# Patient Record
Sex: Male | Born: 1945 | Race: Black or African American | Hispanic: No | Smoking: Never smoker
Health system: Southern US, Community
[De-identification: ages and names within clinical notes are randomized; demographics above are authoritative.]

## PROBLEM LIST (undated history)

## (undated) DIAGNOSIS — I10 Essential (primary) hypertension: Secondary | ICD-10-CM

## (undated) DIAGNOSIS — E785 Hyperlipidemia, unspecified: Secondary | ICD-10-CM

## (undated) DIAGNOSIS — Z5189 Encounter for other specified aftercare: Secondary | ICD-10-CM

## (undated) DIAGNOSIS — H409 Unspecified glaucoma: Secondary | ICD-10-CM

## (undated) DIAGNOSIS — IMO0002 Reserved for concepts with insufficient information to code with codable children: Secondary | ICD-10-CM

## (undated) HISTORY — DX: Hyperlipidemia, unspecified: E78.5

## (undated) HISTORY — DX: Unspecified glaucoma: H40.9

## (undated) HISTORY — PX: OTHER SURGICAL HISTORY: SHX169

## (undated) HISTORY — DX: Encounter for other specified aftercare: Z51.89

## (undated) HISTORY — DX: Reserved for concepts with insufficient information to code with codable children: IMO0002

## (undated) HISTORY — PX: CYST EXCISION: SHX5701

## (undated) HISTORY — DX: Essential (primary) hypertension: I10

---

## 2010-01-20 ENCOUNTER — Ambulatory Visit: Payer: Self-pay | Admitting: Family Medicine

## 2010-01-20 DIAGNOSIS — E785 Hyperlipidemia, unspecified: Secondary | ICD-10-CM

## 2010-01-20 DIAGNOSIS — T783XXA Angioneurotic edema, initial encounter: Secondary | ICD-10-CM | POA: Insufficient documentation

## 2010-01-20 DIAGNOSIS — I10 Essential (primary) hypertension: Secondary | ICD-10-CM | POA: Insufficient documentation

## 2010-12-14 NOTE — Assessment & Plan Note (Signed)
Summary: SWOLLEN LIP/KH   Vital Signs:  Patient Profile:   65 Years Old Male CC:      Swollen lip x 1 hr O2 Sat:      98 % O2 treatment:    Room Air Temp:     97.1 degrees F oral Pulse rate:   54 / minute Pulse rhythm:   regular Resp:     16 per minute BP sitting:   107 / 70  (right arm) Cuff size:   regular  Vitals Entered By: Emilio Math (January 20, 2010 2:57 PM)                  Prior Medication List:  No prior medications documented  Current Allergies: No known allergies History of Present Illness Chief Complaint: Swollen lip x 1 hr History of Present Illness: Patient on the way to eye doctor when he started notice swelling of the lower gum.  His blood pressure was elevated which finally came down. He is feeling better now but of course has concern.  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  Eyes       Denies change in vision, eye pain, eye discharge, glasses, contact lenses, and eye surgery. Ear/Nose/Throat/Mouth       Denies hearing loss/aids, change in hearing, ear pain, ear discharge, dizziness, frequent runny nose, frequent nose bleeds, sinus problems, sore throat, hoarseness, and tooth pain or bleeding.  Respiratory       Denies dry cough, productive cough, wheezing, shortness of breath, asthma, bronchitis, and emphysema/COPD.  Cardiovascular       Denies murmurs, chest pain, and tires easily with exhertion.     Gastrointestinal       Denies stomach pain, nausea/vomiting, diarrhea, constipation, blood in bowel movements, and indigestion. Genitourniary       Denies painful urination, kidney stones, and loss of urinary control. Neurological       Denies paralysis, seizures, and fainting/blackouts. Musculoskeletal       Complains of swelling.      Denies muscle pain, joint pain, joint stiffness, decreased range of motion, redness, muscle weakness, and gout.  Skin       Denies bruising, unusual mles/lumps or sores, and hair/skin or nail changes.  Psych       Denies mood changes, temper/anger issues, anxiety/stress, speech problems, depression, and sleep problems.  Past History:  Family History: Last updated: 01/20/2010 Mother, D, Breast CA Father, HTN, Stroke, D  Social History: Last updated: 01/20/2010 Non-smoker ETOH-yes No Drugs Retired Air cabin crew  Past Medical History: Hyperlipidemia Hypertension  Past Surgical History: Exploratory  Family History: Reviewed history and no changes required. Mother, D, Breast CA Father, HTN, Stroke, D  Social History: Reviewed history and no changes required. 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  Plan New Medications/Changes: ALLEGRA 180 MG /  CLATITIN 10 MG 1 by  mouth qday until swelling of lip resolves  #30 x 0, 01/20/2010, Hassan Rowan MD DIOVAN HCT 80-12.5 MG TABS (VALSARTAN-HYDROCHLOROTHIAZIDE) Take 1 tab daily  #30 x 0, 01/20/2010, Hassan Rowan MD NORVASC 2.5 MG TABS (AMLODIPINE BESYLATE) 1 by mouth q day  #30 x 0, 01/20/2010, Hassan Rowan MD  New Orders: New Patient Level III [phone] Solumedrol up to 125mg  [J2930] Epinephrine 1ml [J0170] Epinephrine 1ml [J0170] Solumedrol up to 125mg  [J2930] Admin of Therapeutic Inj  intramuscular or subcutaneous [96372] Planning Comments:   will need long term fgolow up  stop lotrel or any other aces  Follow Up: Follow up in 2-3 days if no improvement, Follow up with Primary Physician Work/School Excuse: Return to work/school tomorrow  The patient and/or caregiver has been counseled thoroughly with regard to medications prescribed including dosage, schedule, interactions, rationale for use, and possible side effects and they verbalize understanding.  Diagnoses and expected course of recovery discussed and will return if not improved as expected or if the condition worsens. Patient and/or caregiver verbalized understanding.  Prescriptions: ALLEGRA 180 MG /  CLATITIN 10 MG 1 by mouth qday until swelling of lip resolves  #30 x 0   Entered and Authorized by:   Hassan Rowan MD   Signed by:   Hassan Rowan MD on 01/20/2010   Method used:   Print then Give to Patient   RxID:   [id number] DIOVAN HCT 80-12.5 MG TABS (VALSARTAN-HYDROCHLOROTHIAZIDE) Take 1 tab daily  #30 x 0   Entered and Authorized by:   Hassan Rowan MD   Signed by:   Hassan Rowan MD on 01/20/2010   Method used:   Print then Give to Patient   RxID:   [id number] NORVASC 2.5 MG TABS (AMLODIPINE BESYLATE) 1 by mouth q day  #30 x 0   Entered and Authorized by:   Hassan Rowan MD   Signed by:   Hassan Rowan MD on 01/20/2010   Method used:   Print then Give to Patient   RxID:   [id number]   Patient Instructions: 1)  Stop taking any  ACES 2)  Switch to [facility] to help control BP  3)  use claritin or alegra s neded for lip swelling 4)  Please schedule an appointment with your primary doctor in :1 to 20 days  5)  seek imediate medical care if futher swelling or shortness of breatgh occurs   Medication Administration  Injection # 1:    Medication: Solumedrol up to 125mg     Diagnosis: ANGIOEDEMA (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.1)    Route: IM    Site: RUOQ gluteus    Exp Date: 07/15/2012    Lot #: [id number]    Mfr: Pharmacia    Patient tolerated injection without complications    Given by: Emilio Math (January 20, 2010 3:52 PM)  Injection # 2:    Medication: Epinephrine 1ml    Diagnosis: ALLERGIC REACTION (ICD-995.3)    Route: SQ    Site: RUOQ gluteus    Exp Date: 08/14/2010    Lot #: [id number]    Mfr: Francisca December    Comments: x3  Given by: Emilio Math (January 20, 2010 3:56 PM)  Orders Added: 1)  New Patient Level III [99203] 2)  Solumedrol up to 125mg  [J2930] 3)  Epinephrine 1ml [J0170] 4)  Epinephrine 1ml [J0170] 5)  Solumedrol up to 125mg  [J2930] 6)  Admin of Therapeutic Inj  intramuscular or subcutaneous [96372]   Appended Document: SWOLLEN LIP/KH Pt came in requesting BP check. Pt stated he was hitting golf balls, went into pharmacy to check his BP, results were high. Checked pt's BP three times: 1st: 155/88 P 76, 2nd: 133/82 P 67, 3rd 122-82 P 67. Pt was given a PCP card for upstairs so that he could call on Monday, 05/24/10 to establish w/them. Pt stated he did not have a PCP and was having his medications filled by PCP in [place]. Pt presented empty medication bottles that were last refilled in May/2011. Per Dr. Cathren Harsh, pt was advised by the physician on his visit here on 01/20/2010 that he needed a local PCP to follow his HTN and hyperlipidemia. Pt statesd he understood and would contact [facility] on Monday, 05/24/10.

## 2011-11-29 ENCOUNTER — Ambulatory Visit (INDEPENDENT_AMBULATORY_CARE_PROVIDER_SITE_OTHER): Payer: Managed Care, Other (non HMO) | Admitting: Emergency Medicine

## 2011-11-29 DIAGNOSIS — E782 Mixed hyperlipidemia: Secondary | ICD-10-CM

## 2011-11-29 DIAGNOSIS — I10 Essential (primary) hypertension: Secondary | ICD-10-CM

## 2011-11-30 ENCOUNTER — Ambulatory Visit (INDEPENDENT_AMBULATORY_CARE_PROVIDER_SITE_OTHER): Payer: Managed Care, Other (non HMO)

## 2011-11-30 DIAGNOSIS — I10 Essential (primary) hypertension: Secondary | ICD-10-CM

## 2011-11-30 DIAGNOSIS — E7800 Pure hypercholesterolemia, unspecified: Secondary | ICD-10-CM

## 2012-01-03 ENCOUNTER — Telehealth: Payer: Self-pay

## 2012-01-03 NOTE — Telephone Encounter (Signed)
Pharmacy needs auth to do 90day rx instead of 30day rx for pt please contact pharmacy [phone]

## 2012-01-04 ENCOUNTER — Other Ambulatory Visit: Payer: Self-pay | Admitting: Physician Assistant

## 2012-01-04 NOTE — Telephone Encounter (Signed)
Pt 2nd message he is out of his blood pressure meds and needs Dr to call pharmacy and change rx to 90 day instead of 30 day

## 2012-01-05 NOTE — Telephone Encounter (Signed)
What are his meds?

## 2012-01-05 NOTE — Telephone Encounter (Signed)
Doses? When was his last visit?  Next visit?  OK to RF either until next visit, or x 30 days if not seen in past 6 months. csj

## 2012-01-05 NOTE — Telephone Encounter (Signed)
Pt states he is on Amlodipine and Diovan. He needs 90 day supply of these and uses [facility].

## 2012-01-05 NOTE — Telephone Encounter (Signed)
Spoke with [facility] and changed Rxs that Dr Cleta Alberts wrote in Jan to 90 day at a time x 1 yr RFs instead of 30 days. Pt currently taking Amlodipine 10 QD and Diovan 160 QD. Notified pt.

## 2012-01-11 ENCOUNTER — Telehealth: Payer: Self-pay

## 2012-01-11 NOTE — Telephone Encounter (Signed)
Patient states he received his 90 day rx on two of his medications but still needs his 90day rx for his provestatan 40mg  he would like for a nurse or dr to contact  Him @ [phone]

## 2012-01-12 NOTE — Telephone Encounter (Signed)
Called [facility] and OKd change to 90 day supply of pravastatin ( with 1 yr RFs as written by Dr Cleta Alberts originally). Pt had stopped at 102 to check on this, and is aware this is being done

## 2012-07-03 ENCOUNTER — Encounter: Payer: Self-pay | Admitting: Emergency Medicine

## 2012-07-03 ENCOUNTER — Other Ambulatory Visit: Payer: Self-pay | Admitting: Emergency Medicine

## 2012-07-03 ENCOUNTER — Ambulatory Visit (INDEPENDENT_AMBULATORY_CARE_PROVIDER_SITE_OTHER): Payer: Managed Care, Other (non HMO) | Admitting: Emergency Medicine

## 2012-07-03 VITALS — BP 139/72 | HR 55 | Temp 97.7°F | Resp 16 | Ht 66.0 in | Wt 160.0 lb

## 2012-07-03 DIAGNOSIS — I10 Essential (primary) hypertension: Secondary | ICD-10-CM

## 2012-07-03 DIAGNOSIS — E785 Hyperlipidemia, unspecified: Secondary | ICD-10-CM

## 2012-07-03 DIAGNOSIS — D369 Benign neoplasm, unspecified site: Secondary | ICD-10-CM

## 2012-07-03 MED ORDER — AMLODIPINE BESYLATE 10 MG PO TABS: 10.0000 mg | ORAL_TABLET | Freq: Every day | ORAL | Status: DC

## 2012-07-03 MED ORDER — VALSARTAN 160 MG PO TABS: 160.0000 mg | ORAL_TABLET | Freq: Every day | ORAL | Status: DC

## 2012-07-03 MED ORDER — PRAVASTATIN SODIUM 40 MG PO TABS: 40.0000 mg | ORAL_TABLET | Freq: Every day | ORAL | Status: DC

## 2012-07-03 NOTE — Progress Notes (Signed)
  Subjective:    Patient ID: Justin Walter, male    DOB: 08/08/1946, 66 y.o.   MRN: [id number]  HPI patient here to get referral for colonoscopy. He has a history of adenomatous polyps. He is not sure whether his last colonoscopy was 3-5 years ago. He had records sent here from [place] but I am unable to locate them. We will try and call his doctor in [place] and see if we get a refax of those records. He is followed here for high cholesterol and high blood pressure. He does need a refill on these medications    Review of Systems     Objective:   Physical Exam HEENT exam is unremarkable neck is supple chest clear abdomen soft there is a large scar from the subxiphoid area down to the pubic symphysis from previous exploratory surgery        Assessment & Plan:  Go ahead and set up an appointment see Dr. Audley Hose so he can have repeat colonoscopy. I've asked him to come in in 3 months we can do fasting blood work.

## 2012-07-04 ENCOUNTER — Telehealth: Payer: Self-pay | Admitting: *Deleted

## 2012-07-04 NOTE — Telephone Encounter (Signed)
07/04/2012 at 1325, patient was call I advised him the referral will be made to see Dr Elnoria Howard. He will be contacted by their office with the date/time for the appointment. Also, colonoscopy results from Dr Corinna Capra will be faxed.

## 2012-08-29 ENCOUNTER — Telehealth: Payer: Self-pay

## 2012-08-29 DIAGNOSIS — I10 Essential (primary) hypertension: Secondary | ICD-10-CM

## 2012-08-29 DIAGNOSIS — E785 Hyperlipidemia, unspecified: Secondary | ICD-10-CM

## 2012-08-29 NOTE — Telephone Encounter (Signed)
Pt would like a lab order prior to appt on 11-12. He said same labs as last time.

## 2012-08-29 NOTE — Telephone Encounter (Signed)
Please let patient know that I have put lab orders in.  Unfortunately at the last visit, Dr. Cleta Alberts did not specify which labs he wanted drawn.  I put orders in for routine labs, but there's a chance that we may need to draw more labs at his visit.  Additionally he will have to pay a co-pay (if he has one) for the lab visit as well.  But, the orders are in if he would still like to do labs prior to his next visit.  He will need to be fasting for at least 8 hours prior for the lipid panel.

## 2012-08-29 NOTE — Telephone Encounter (Signed)
Called patient to advise of this.

## 2012-09-05 ENCOUNTER — Other Ambulatory Visit (INDEPENDENT_AMBULATORY_CARE_PROVIDER_SITE_OTHER): Payer: Managed Care, Other (non HMO)

## 2012-09-05 DIAGNOSIS — E785 Hyperlipidemia, unspecified: Secondary | ICD-10-CM

## 2012-09-05 DIAGNOSIS — I10 Essential (primary) hypertension: Secondary | ICD-10-CM

## 2012-09-05 LAB — CBC
MCV: 85.3 fL (ref 78.0–100.0)
Platelets: 201 10*3/uL (ref 150–400)
RBC: 4.98 MIL/uL (ref 4.22–5.81)
RDW: 13.6 % (ref 11.5–15.5)
WBC: 7.5 10*3/uL (ref 4.0–10.5)

## 2012-09-05 NOTE — Progress Notes (Signed)
PATIENT IN OFFICE TODAY FOR LABS ONLY

## 2012-09-06 LAB — COMPREHENSIVE METABOLIC PANEL
Albumin: 4.4 g/dL (ref 3.5–5.2)
BUN: 11 mg/dL (ref 6–23)
Calcium: 9.7 mg/dL (ref 8.4–10.5)
Chloride: 106 mEq/L (ref 96–112)
Glucose, Bld: 77 mg/dL (ref 70–99)
Potassium: 3.9 mEq/L (ref 3.5–5.3)

## 2012-09-06 LAB — LIPID PANEL
Cholesterol: 165 mg/dL (ref 0–200)
HDL: 41 mg/dL (ref >39)
Triglycerides: 118 mg/dL (ref <150)

## 2012-10-02 ENCOUNTER — Encounter: Payer: Self-pay | Admitting: Emergency Medicine

## 2012-10-02 ENCOUNTER — Ambulatory Visit (INDEPENDENT_AMBULATORY_CARE_PROVIDER_SITE_OTHER): Payer: Managed Care, Other (non HMO) | Admitting: Emergency Medicine

## 2012-10-02 VITALS — BP 131/69 | HR 70 | Temp 97.8°F | Resp 16 | Ht 67.0 in | Wt 165.0 lb

## 2012-10-02 DIAGNOSIS — I10 Essential (primary) hypertension: Secondary | ICD-10-CM

## 2012-10-02 DIAGNOSIS — E782 Mixed hyperlipidemia: Secondary | ICD-10-CM

## 2012-10-02 DIAGNOSIS — E785 Hyperlipidemia, unspecified: Secondary | ICD-10-CM

## 2012-10-02 MED ORDER — VALSARTAN 160 MG PO TABS: 160.0000 mg | ORAL_TABLET | Freq: Every day | ORAL | Status: DC

## 2012-10-02 MED ORDER — PRAVASTATIN SODIUM 40 MG PO TABS: 40.0000 mg | ORAL_TABLET | Freq: Every day | ORAL | Status: DC

## 2012-10-02 MED ORDER — AMLODIPINE BESYLATE 10 MG PO TABS: 10.0000 mg | ORAL_TABLET | Freq: Every day | ORAL | Status: DC

## 2012-10-02 NOTE — Progress Notes (Signed)
  Subjective:    Patient ID: Justin Walter, male    DOB: [birth year], 66 y.o.   MRN: [id number]  HPI #1 high blood pressure. He is in his medications refilled. He did have trouble with bradycardia in the past but this was related to the beta blocker eye drops. He denies chest pain shortness of breath or any other cardiac symptoms at the present time.  Problem #2 is high cholesterol. He's currently on Pravachol watching his diet and exercising regular. Problem #3 bradycardia this has not been an issue since he stopped his beta blocker eye drops. He continues to exercise on regular basis and has no presyncope to    Review of Systems     Objective:   Physical Exam HEENT exam is unremarkable. His neck was supple. Chest was clear to auscultation and percussion cardiac exam revealed regular rate without murmurs        Assessment & Plan:  Patient stable at present. Blood pressure was a goal his lipid panel is a goal he is having no symptoms of bradycardia. We'll recheck in 6 months. He has a history of an elevated PSA and this is being followed closely every 6 months at [facility].

## 2012-11-26 ENCOUNTER — Ambulatory Visit (INDEPENDENT_AMBULATORY_CARE_PROVIDER_SITE_OTHER): Payer: Managed Care, Other (non HMO) | Admitting: Family Medicine

## 2012-11-26 VITALS — BP 132/76 | HR 89 | Temp 99.1°F | Resp 16 | Ht 67.0 in | Wt 165.2 lb

## 2012-11-26 DIAGNOSIS — B349 Viral infection, unspecified: Secondary | ICD-10-CM

## 2012-11-26 DIAGNOSIS — R300 Dysuria: Secondary | ICD-10-CM

## 2012-11-26 DIAGNOSIS — B9789 Other viral agents as the cause of diseases classified elsewhere: Secondary | ICD-10-CM

## 2012-11-26 DIAGNOSIS — R509 Fever, unspecified: Secondary | ICD-10-CM

## 2012-11-26 LAB — POCT UA - MICROSCOPIC ONLY
Casts, Ur, LPF, POC: NEGATIVE
Yeast, UA: NEGATIVE

## 2012-11-26 LAB — POCT URINALYSIS DIPSTICK
Bilirubin, UA: NEGATIVE
Glucose, UA: NEGATIVE
Nitrite, UA: NEGATIVE
Urobilinogen, UA: 0.2
pH, UA: 5.5

## 2012-11-26 LAB — POCT CBC
Granulocyte percent: 71.8 %G (ref 37–80)
MID (cbc): 0.3 (ref 0–0.9)
MPV: 9.7 fL (ref 0–99.8)
POC MID %: 5.5 %M (ref 0–12)
Platelet Count, POC: 163 10*3/uL (ref 142–424)
RBC: 5.7 M/uL (ref 4.69–6.13)

## 2012-11-26 LAB — POCT INFLUENZA A/B: Influenza B, POC: NEGATIVE

## 2012-11-26 NOTE — Patient Instructions (Signed)
Fluids  Rest  Return if worse  Tylenol or ibuprofen for fever or body aches.

## 2012-11-26 NOTE — Progress Notes (Signed)
Subjective: 67 year old man who is here with a history of having gotten ill this weekend.  He started running a fever on Saturday. He has had chills through yesterday. He never took his temperature. He has a rather shortage of other symptoms.He had a slight headache but nothing much else except possibly some mild dysuria. No URI or cough or stomach problems. He did not take his temperature but he knows he chills. He is single and lives alone. He does go to yoga 2 times a week.  Objective: Pleasant gentleman, alert and oriented, in no major distress. His TMs are normal. Throat clear. Neck supple without significant nodes. Chest is clear to auscultation. Heart regular without murmurs. Abdomen soft without masses or tenderness. No CVA tenderness.  Assessment: Febrile illness etiology unclear  Plan: Check flu swab, urinalysis, and CBC  Results for orders placed in visit on 11/26/12  POCT CBC      Component Value Range   WBC 5.8  4.6 - 10.2 K/uL   Lymph, poc 1.3  0.6 - 3.4   POC LYMPH PERCENT 22.7  10 - 50 %L   MID (cbc) 0.3  0 - 0.9   POC MID % 5.5  0 - 12 %M   POC Granulocyte 4.2  2 - 6.9   Granulocyte percent 71.8  37 - 80 %G   RBC 5.70  4.69 - 6.13 M/uL   Hemoglobin 17.0  14.1 - 18.1 g/dL   HCT, POC 29.5  28.4 - 53.7 %   MCV 91.2  80 - 97 fL   MCH, POC 29.8  27 - 31.2 pg   MCHC 32.7  31.8 - 35.4 g/dL   RDW, POC 13.2     Platelet Count, POC 163  142 - 424 K/uL   MPV 9.7  0 - 99.8 fL  POCT INFLUENZA A/B      Component Value Range   Influenza A, POC Negative     Influenza B, POC Negative    POCT UA - MICROSCOPIC ONLY      Component Value Range   WBC, Ur, HPF, POC 2-5     RBC, urine, microscopic 0-1     Bacteria, U Microscopic neg     Mucus, UA neg     Epithelial cells, urine per micros 0-1     Crystals, Ur, HPF, POC neg     Casts, Ur, LPF, POC neg     Yeast, UA neg    POCT URINALYSIS DIPSTICK      Component Value Range   Color, UA yellow     Clarity, UA clear     Glucose,  UA neg     Bilirubin, UA neg     Ketones, UA neg     Spec Grav, UA 1.010     Blood, UA neg     pH, UA 5.5     Protein, UA neg     Urobilinogen, UA 0.2     Nitrite, UA neg     Leukocytes, UA Trace     Labs look good. This is consistent with a viral illness. Will treat symptomatically.  Denies STD risk  Will culture urine

## 2013-01-22 DIAGNOSIS — H4010X0 Unspecified open-angle glaucoma, stage unspecified: Secondary | ICD-10-CM | POA: Diagnosis not present

## 2013-03-06 DIAGNOSIS — N401 Enlarged prostate with lower urinary tract symptoms: Secondary | ICD-10-CM | POA: Diagnosis not present

## 2013-03-06 DIAGNOSIS — R972 Elevated prostate specific antigen [PSA]: Secondary | ICD-10-CM | POA: Diagnosis not present

## 2013-03-27 DIAGNOSIS — H4011X0 Primary open-angle glaucoma, stage unspecified: Secondary | ICD-10-CM | POA: Diagnosis not present

## 2013-03-28 ENCOUNTER — Ambulatory Visit (INDEPENDENT_AMBULATORY_CARE_PROVIDER_SITE_OTHER): Payer: Medicare Other | Admitting: Emergency Medicine

## 2013-03-28 VITALS — BP 118/71 | HR 58 | Temp 98.0°F | Resp 16 | Ht 66.5 in | Wt 163.6 lb

## 2013-03-28 DIAGNOSIS — B079 Viral wart, unspecified: Secondary | ICD-10-CM | POA: Diagnosis not present

## 2013-03-28 DIAGNOSIS — I10 Essential (primary) hypertension: Secondary | ICD-10-CM | POA: Diagnosis not present

## 2013-03-28 DIAGNOSIS — E785 Hyperlipidemia, unspecified: Secondary | ICD-10-CM

## 2013-03-28 DIAGNOSIS — L989 Disorder of the skin and subcutaneous tissue, unspecified: Secondary | ICD-10-CM

## 2013-03-28 LAB — POCT CBC
Hemoglobin: 13.7 g/dL — AB (ref 14.1–18.1)
Lymph, poc: 2 (ref 0.6–3.4)
MCH, POC: 29.3 pg (ref 27–31.2)
MCHC: 32 g/dL (ref 31.8–35.4)
MID (cbc): 0.3 (ref 0–0.9)
MPV: 9.7 fL (ref 0–99.8)
POC LYMPH PERCENT: 44.3 %L (ref 10–50)
POC MID %: 6.9 %M (ref 0–12)
RDW, POC: 14 %
WBC: 4.5 10*3/uL — AB (ref 4.6–10.2)

## 2013-03-28 LAB — COMPREHENSIVE METABOLIC PANEL
ALT: 29 U/L (ref 0–53)
Alkaline Phosphatase: 60 U/L (ref 39–117)
CO2: 22 mEq/L (ref 19–32)
Creat: 1.06 mg/dL (ref 0.50–1.35)
Total Bilirubin: 0.7 mg/dL (ref 0.3–1.2)

## 2013-03-28 MED ORDER — PRAVASTATIN SODIUM 40 MG PO TABS: 40.0000 mg | ORAL_TABLET | Freq: Every day | ORAL | Status: DC

## 2013-03-28 MED ORDER — AMLODIPINE BESYLATE 10 MG PO TABS: 10.0000 mg | ORAL_TABLET | Freq: Every day | ORAL | Status: DC

## 2013-03-28 MED ORDER — VALSARTAN 160 MG PO TABS: 160.0000 mg | ORAL_TABLET | Freq: Every day | ORAL | Status: DC

## 2013-03-28 NOTE — Progress Notes (Signed)
  Subjective:    Patient ID: Justin Walter, male    DOB: [birth year], 67 y.o.   MRN: [id number]  HPI presents today with skin tag above R eye beside eyebrow and would like that removed. Also has question on Diovan and wants to discuss getting that in generic. Wants to discuss the difference. Will needs 90 day supply on all medications. Wants those printed and not e-scribed. Not sure if last physical hd his ears checked.     Review of Systems     Objective:   Physical Exam patient has a 3 mm skin tag at the bridge of the nose on the right just medial to the right eyebrow neck is supple chest clear heart regular rate no murmurs abdomen is soft nontender liver and spleen not enlarged extremities no edema.        Assessment & Plan:  Lesion of the left eyebrow will be sent for pathology labs were done patient scheduled for PE

## 2013-03-29 ENCOUNTER — Telehealth: Payer: Self-pay | Admitting: Emergency Medicine

## 2013-03-29 NOTE — Telephone Encounter (Signed)
Call patient let him know his labs are normal. The sugar was slightly low. Just make sure he does not skip meals.

## 2013-03-29 NOTE — Telephone Encounter (Signed)
Left message for him to call me back.  

## 2013-03-31 NOTE — Telephone Encounter (Signed)
Pt notified of labs

## 2013-07-31 DIAGNOSIS — H4011X0 Primary open-angle glaucoma, stage unspecified: Secondary | ICD-10-CM | POA: Diagnosis not present

## 2013-07-31 DIAGNOSIS — R972 Elevated prostate specific antigen [PSA]: Secondary | ICD-10-CM | POA: Diagnosis not present

## 2013-08-07 DIAGNOSIS — N402 Nodular prostate without lower urinary tract symptoms: Secondary | ICD-10-CM | POA: Diagnosis not present

## 2013-08-07 DIAGNOSIS — R972 Elevated prostate specific antigen [PSA]: Secondary | ICD-10-CM | POA: Diagnosis not present

## 2013-08-08 ENCOUNTER — Other Ambulatory Visit (HOSPITAL_COMMUNITY): Payer: Self-pay | Admitting: Urology

## 2013-08-08 DIAGNOSIS — N402 Nodular prostate without lower urinary tract symptoms: Secondary | ICD-10-CM

## 2013-08-14 ENCOUNTER — Ambulatory Visit (HOSPITAL_COMMUNITY)
Admission: RE | Admit: 2013-08-14 | Discharge: 2013-08-14 | Disposition: A | Discharge status: Home / Self Care | Payer: Medicare Other | Source: Ambulatory Visit | Attending: Urology | Admitting: Urology

## 2013-08-14 DIAGNOSIS — N400 Enlarged prostate without lower urinary tract symptoms: Secondary | ICD-10-CM | POA: Diagnosis not present

## 2013-08-14 DIAGNOSIS — R972 Elevated prostate specific antigen [PSA]: Secondary | ICD-10-CM | POA: Insufficient documentation

## 2013-08-14 DIAGNOSIS — N402 Nodular prostate without lower urinary tract symptoms: Secondary | ICD-10-CM

## 2013-08-14 LAB — CREATININE, SERUM
Creatinine, Ser: 1.04 mg/dL (ref 0.50–1.35)
GFR calc Af Amer: 85 mL/min — ABNORMAL LOW (ref >90)
GFR calc non Af Amer: 73 mL/min — ABNORMAL LOW (ref >90)

## 2013-08-14 MED ORDER — GADOBENATE DIMEGLUMINE 529 MG/ML IV SOLN
15.0000 mL | Freq: Once | INTRAVENOUS | Status: AC | PRN
  Administered 2013-08-14: 15 mL via INTRAVENOUS

## 2013-08-26 DIAGNOSIS — R972 Elevated prostate specific antigen [PSA]: Secondary | ICD-10-CM | POA: Diagnosis not present

## 2013-08-26 DIAGNOSIS — N401 Enlarged prostate with lower urinary tract symptoms: Secondary | ICD-10-CM | POA: Diagnosis not present

## 2013-09-03 ENCOUNTER — Ambulatory Visit (INDEPENDENT_AMBULATORY_CARE_PROVIDER_SITE_OTHER): Payer: Medicare Other | Admitting: Family Medicine

## 2013-09-03 VITALS — BP 114/64 | HR 66 | Temp 97.5°F | Resp 18 | Ht 65.75 in | Wt 160.8 lb

## 2013-09-03 DIAGNOSIS — L089 Local infection of the skin and subcutaneous tissue, unspecified: Secondary | ICD-10-CM | POA: Diagnosis not present

## 2013-09-03 DIAGNOSIS — H409 Unspecified glaucoma: Secondary | ICD-10-CM | POA: Diagnosis not present

## 2013-09-03 LAB — POCT CBC
Granulocyte percent: 55.7 %G (ref 37–80)
Hemoglobin: 15.4 g/dL (ref 14.1–18.1)
MCV: 92.3 fL (ref 80–97)
MID (cbc): 0.3 (ref 0–0.9)
MPV: 10.6 fL (ref 0–99.8)
POC Granulocyte: 3.3 (ref 2–6.9)
POC MID %: 5.7 %M (ref 0–12)
Platelet Count, POC: 185 10*3/uL (ref 142–424)
RBC: 5.04 M/uL (ref 4.69–6.13)

## 2013-09-03 MED ORDER — DOXYCYCLINE HYCLATE 100 MG PO TABS: 100.0000 mg | ORAL_TABLET | Freq: Two times a day (BID) | ORAL | Status: DC

## 2013-09-03 NOTE — Patient Instructions (Signed)
Keep your wound covered and clean.  Wash it with regular soap, pat dry and then cover with a bandage.  When the dead skin dries out it can be carefully trimmed.  Please come and see [place] if you have any concerns about how your hand is doing.  It your pain does not continue to improve, or if you start getting worse please come and see [place] right away  You received a Tdap vaccine today- this covers you for tetanus for 10 years, as well as diptheria and pertussis or whooping cough. Your next tetanus shot can be a plain Td vaccine.   Use the doxycycline as directed.  Take with food and water.

## 2013-09-03 NOTE — Progress Notes (Addendum)
[facility] [address] [place] [address] [phone]  Date:  09/03/2013   Name:  Justin Walter   DOB:  1946/08/26   MRN:  [id number]  PCP:  Lucilla Edin, MD    Chief Complaint: Hand Injury   History of Present Illness:  Justin Walter is a 67 y.o. very pleasant male patient who presents with the following:  He had an MRI 3 weeks ago to evaluate his prostate.  He had blood draw/ IV for contrast in his right hand- he did not think he had any particular injury, but he noted some pain in the proximal arm for a few days after the MRI.  This seemed to resolve, but then he noted a pustule on the palm on the right hand and another pustule/ blood blister over the lateral base of the small finger.  The area at the base of the small finger got worse last night and started to hurt more.  Admits that he plays a whole lot of golf and that he may have rubbed his hand.    He is not sure of the date of his last tetanus shot but thinks it was a long time ago He otherwise feels well, and does not note any systemic symptoms such as fever, chills or aches   Patient Active Problem List   Diagnosis Date Noted  . HYPERLIPIDEMIA 01/20/2010  . HYPERTENSION 01/20/2010  . 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    Past Medical History  Diagnosis Date  . Hypertension   . Hyperlipidemia   . Ulcer   . Glaucoma     Past Surgical History  Procedure Laterality Date  . Bleeding ulcer      1971  . Cyst excision      forehead    History  Substance Use Topics  . Smoking status: Never Smoker   . Smokeless tobacco: Not on file  . Alcohol Use: 1.0 oz/week    2 drink(s) per week     Comment: occass    Family History  Problem Relation Age of Onset  . Cancer Mother     pancreatic cancer  . Stroke Father     Allergies  Allergen Reactions  . Lotrel [Amlodipine Besy-Benazepril Hcl] Swelling    Medication list has been reviewed and updated.  Current Outpatient Prescriptions on File  Prior to Visit  Medication Sig Dispense Refill  . amLODipine (NORVASC) 10 MG tablet Take 1 tablet (10 mg total) by mouth daily.  90 tablet  3  . bimatoprost (LUMIGAN) 0.03 % ophthalmic solution 1 drop at bedtime.      . dorzolamide (TRUSOPT) 2 % ophthalmic solution 1 drop 3 (three) times daily.      . pravastatin (PRAVACHOL) 40 MG tablet Take 1 tablet (40 mg total) by mouth daily.  90 tablet  3  . valsartan (DIOVAN) 160 MG tablet Take 1 tablet (160 mg total) by mouth daily.  90 tablet  3   No current facility-administered medications on file prior to visit.    Review of Systems:  As per HPI- otherwise negative.   Physical Examination: Filed Vitals:   09/03/13 0837  BP: 114/64  Pulse: 66  Temp: 97.5 F (36.4 C)  Resp: 18   Filed Vitals:   09/03/13 0837  Height: 5' 5.75" (1.67 m)  Weight: 160 lb 12.8 oz (72.938 kg)   Body mass index is 26.15 kg/(m^2). Ideal Body Weight: Weight in (lb) to have BMI = 25: 153.4  GEN:  WDWN, NAD, Non-toxic, A & O x 3, looks well HEENT: Atraumatic, Normocephalic. Neck supple. No masses, No LAD. Ears and Nose: No external deformity. CV: RRR, No M/G/R. No JVD. No thrill. No extra heart sounds. PULM: CTA B, no wheezes, crackles, rhonchi. No retractions. No resp. distress. No accessory muscle use. EXTR: No c/c/e NEURO Normal gait.  PSYCH: Normally interactive. Conversant. Not depressed or anxious appearing.  Calm demeanor.  Right hand: there is a small lesion on the palm at the base of the 4th [facility]; it appears to be a small blood blister under a callus.  There is no associated redness or tenderness.  This area does not appear to be infected.   At the base of the small finger- lateral side of the hand- there is a nickel sized dark blister that appears to be filled with blood and possibly some pus.  There is slight redness around this lesion.  Normal ROM of the finger, normal cap refill and circulation.    VC obtained.  Area prepped with betadine and  alcohol.  Small incision made with 11 blade through dead skin overlying large blister.  Dark blood and a scant amount of pus expressed.  Dressed with non- stick pad and cobain.  He felt immediate relief.  Culture taken  Results for orders placed in visit on 09/03/13  POCT CBC      Result Value Range   WBC 5.9  4.6 - 10.2 K/uL   Lymph, poc 2.3  0.6 - 3.4   POC LYMPH PERCENT 38.6  10 - 50 %L   MID (cbc) 0.3  0 - 0.9   POC MID % 5.7  0 - 12 %M   POC Granulocyte 3.3  2 - 6.9   Granulocyte percent 55.7  37 - 80 %G   RBC 5.04  4.69 - 6.13 M/uL   Hemoglobin 15.4  14.1 - 18.1 g/dL   HCT, POC 40.9  81.1 - 53.7 %   MCV 92.3  80 - 97 fL   MCH, POC 30.6  27 - 31.2 pg   MCHC 33.1  31.8 - 35.4 g/dL   RDW, POC 91.4     Platelet Count, POC 185  142 - 424 K/uL   MPV 10.6  0 - 99.8 fL    Assessment and Plan: Infection of hand - Plan: POCT CBC, Wound culture, Tdap vaccine greater than or equal to 7yo IM, doxycycline (VIBRA-TABS) 100 MG tablet  Glaucoma  Large blister on right hand, possibly infected.  Treat with doxycycline, update tdap today.  At this time I do not see evidence of dangerous or invasive infection. However he is instructed to keep a careful watch on his hand and to come back or call if he does not see steady improvement.  Advised him not to play golf for at least a week to allow this area to heal.   Signed Abbe Amsterdam, MD  10/24: received final culture result- negative.  He is doing better.  He will follow-up if not continuing to improve

## 2013-09-05 LAB — WOUND CULTURE: Gram Stain: NONE SEEN

## 2013-10-03 ENCOUNTER — Telehealth: Payer: Self-pay

## 2013-10-03 DIAGNOSIS — I10 Essential (primary) hypertension: Secondary | ICD-10-CM

## 2013-10-03 NOTE — Telephone Encounter (Signed)
Valsartan called him to advise

## 2013-10-03 NOTE — Telephone Encounter (Signed)
Patient of Dr Cleta Alberts. Says the last time he saw the doctor he was told about a generic for Diovan that was coming out the first of the year. Wants to know the name of the generic because he needs it for his rx drug plan. He's not scheduled to come back until January. Cb# [phone].

## 2013-11-20 DIAGNOSIS — H4011X0 Primary open-angle glaucoma, stage unspecified: Secondary | ICD-10-CM | POA: Diagnosis not present

## 2013-12-10 ENCOUNTER — Encounter: Payer: Self-pay | Admitting: Emergency Medicine

## 2013-12-10 ENCOUNTER — Encounter: Payer: Medicare Other | Admitting: Emergency Medicine

## 2013-12-10 ENCOUNTER — Ambulatory Visit (INDEPENDENT_AMBULATORY_CARE_PROVIDER_SITE_OTHER): Payer: Medicare Other | Admitting: Emergency Medicine

## 2013-12-10 VITALS — BP 108/60 | HR 55 | Temp 97.8°F | Resp 16 | Ht 66.0 in | Wt 163.4 lb

## 2013-12-10 DIAGNOSIS — Z0000 Encounter for general adult medical examination without abnormal findings: Secondary | ICD-10-CM

## 2013-12-10 DIAGNOSIS — I10 Essential (primary) hypertension: Secondary | ICD-10-CM | POA: Diagnosis not present

## 2013-12-10 DIAGNOSIS — Z23 Encounter for immunization: Secondary | ICD-10-CM

## 2013-12-10 DIAGNOSIS — E785 Hyperlipidemia, unspecified: Secondary | ICD-10-CM

## 2013-12-10 LAB — POCT URINALYSIS DIPSTICK
Bilirubin, UA: NEGATIVE
Blood, UA: NEGATIVE
GLUCOSE UA: NEGATIVE
KETONES UA: NEGATIVE
LEUKOCYTES UA: NEGATIVE
Nitrite, UA: NEGATIVE
Protein, UA: NEGATIVE
Spec Grav, UA: 1.01
Urobilinogen, UA: 0.2
pH, UA: 6

## 2013-12-10 LAB — LIPID PANEL
Cholesterol: 187 mg/dL (ref 0–200)
HDL: 44 mg/dL (ref >39)
LDL CALC: 104 mg/dL — AB (ref 0–99)
Total CHOL/HDL Ratio: 4.3 Ratio
Triglycerides: 195 mg/dL — ABNORMAL HIGH (ref <150)
VLDL: 39 mg/dL (ref 0–40)

## 2013-12-10 LAB — CBC WITH DIFFERENTIAL/PLATELET
BASOS PCT: 0 % (ref 0–1)
Basophils Absolute: 0 10*3/uL (ref 0.0–0.1)
Eosinophils Absolute: 0.1 10*3/uL (ref 0.0–0.7)
Eosinophils Relative: 1 % (ref 0–5)
HCT: 46.1 % (ref 39.0–52.0)
Hemoglobin: 15.5 g/dL (ref 13.0–17.0)
LYMPHS PCT: 43 % (ref 12–46)
Lymphs Abs: 2.4 10*3/uL (ref 0.7–4.0)
MCH: 28.9 pg (ref 26.0–34.0)
MCHC: 33.6 g/dL (ref 30.0–36.0)
MCV: 86 fL (ref 78.0–100.0)
MONOS PCT: 6 % (ref 3–12)
Monocytes Absolute: 0.4 10*3/uL (ref 0.1–1.0)
NEUTROS ABS: 2.8 10*3/uL (ref 1.7–7.7)
NEUTROS PCT: 50 % (ref 43–77)
Platelets: 178 10*3/uL (ref 150–400)
RBC: 5.36 MIL/uL (ref 4.22–5.81)
RDW: 13.8 % (ref 11.5–15.5)
WBC: 5.7 10*3/uL (ref 4.0–10.5)

## 2013-12-10 LAB — COMPLETE METABOLIC PANEL WITH GFR
ALT: 30 U/L (ref 0–53)
AST: 31 U/L (ref 0–37)
Albumin: 4.4 g/dL (ref 3.5–5.2)
Alkaline Phosphatase: 65 U/L (ref 39–117)
BUN: 15 mg/dL (ref 6–23)
CALCIUM: 10 mg/dL (ref 8.4–10.5)
CHLORIDE: 105 meq/L (ref 96–112)
CO2: 28 meq/L (ref 19–32)
CREATININE: 1.3 mg/dL (ref 0.50–1.35)
GFR, EST NON AFRICAN AMERICAN: 57 mL/min — AB
GFR, Est African American: 66 mL/min
GLUCOSE: 85 mg/dL (ref 70–99)
Potassium: 4.4 mEq/L (ref 3.5–5.3)
Sodium: 141 mEq/L (ref 135–145)
Total Bilirubin: 1 mg/dL (ref 0.3–1.2)
Total Protein: 7.7 g/dL (ref 6.0–8.3)

## 2013-12-10 MED ORDER — PRAVASTATIN SODIUM 40 MG PO TABS: 40.0000 mg | ORAL_TABLET | Freq: Every day | ORAL | Status: DC

## 2013-12-10 MED ORDER — AMLODIPINE BESYLATE 10 MG PO TABS: 10.0000 mg | ORAL_TABLET | Freq: Every day | ORAL | Status: DC

## 2013-12-10 MED ORDER — VALSARTAN 160 MG PO TABS: ORAL_TABLET | ORAL | Status: DC

## 2013-12-10 MED ORDER — ZOSTER VACCINE LIVE 19400 UNT/0.65ML ~~LOC~~ SOLR: 0.6500 mL | Freq: Once | SUBCUTANEOUS | Status: DC

## 2013-12-10 MED ORDER — DESONIDE 0.05 % EX CREA: TOPICAL_CREAM | Freq: Two times a day (BID) | CUTANEOUS | Status: AC

## 2013-12-10 NOTE — Progress Notes (Deleted)
   Subjective:    Patient ID: Justin Walter, male    DOB: 1946/06/24, 68 y.o.   MRN: [id number]  HPI    Review of Systems  Constitutional: Negative.   HENT: Negative.   Eyes: Negative.   Respiratory: Negative.   Cardiovascular: Negative.   Gastrointestinal: Negative.   Endocrine: Negative.   Genitourinary: Negative.   Musculoskeletal: Negative.   Skin: Negative.   Allergic/Immunologic: Negative.   Neurological: Negative.   Hematological: Negative.   Psychiatric/Behavioral: Negative.        Objective:   Physical Exam        Assessment & Plan:

## 2013-12-10 NOTE — Progress Notes (Addendum)
This chart was scribed for Arlyss Queen, MD by Roxan Diesel, ED scribe.  This patient was seen in Room 21 and the patient's care was started at 9:20 AM.   Subjective  HPI Justin Walter is a 68 y.o. male who presents for a regular check-up and complete physical exam.  Overall he states he is doing well and he has no complaints at this time.  He saw a urologist recently and had an MRI done which showed an abnormal area in his prostate.  He states that his urologist is considering possibly doing a biopsy of that area.  He was advised to make a decision about this before his next visit.  Pt has had a previous prostate biopsy which was normal.  He denies family h/o prostate cancer.  His last colonoscopy was in 2013.  His urologist is following his PSA at appointments approximately every 6 months.  He has not had shingles shot, pneumonia shot or flu shot.  He is not sure whether he wants shingles or pneumonia shots.  Pt thinks he may have received a blood transfusion due to a bleeding ulcer at age 87 or 68.  He knows that he had exploratory surgery but he is not sure whether he had a transfusion.    He sees an ophthalmologist on a 58-monthschedule.  He does not smoke.  He rides an recumbent bike 3-4 times/week for around 50 minutes, and does light resistance exercise.  He recently began doing yoga as well.  Family history:  Mother died of pancreatic cancer.  Sister had cancer but he is unsure what type.   Patient Active Problem List   Diagnosis Date Noted  . Glaucoma 09/03/2013  . HYPERLIPIDEMIA 01/20/2010  . HYPERTENSION 01/20/2010  . 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    Past Medical History  Diagnosis Date  . Hypertension   . Hyperlipidemia   . Ulcer   . Glaucoma   . Blood transfusion without reported diagnosis     Prior to Admission medications   Medication Sig Start Date End Date Taking? Authorizing Provider  bimatoprost (LUMIGAN) 0.03 % ophthalmic solution 1 drop at bedtime.   Yes  Historical Provider, MD  dorzolamide (TRUSOPT) 2 % ophthalmic solution 1 drop 3 (three) times daily.   Yes Historical Provider, MD  pravastatin (PRAVACHOL) 40 MG tablet Take 1 tablet (40 mg total) by mouth daily. 03/28/13  Yes SDarlyne Russian MD  valsartan (DIOVAN) 160 MG tablet Take 1 tablet (160 mg total) by mouth daily. 03/28/13  Yes SDarlyne Russian MD  amLODipine (NORVASC) 10 MG tablet Take 1 tablet (10 mg total) by mouth daily. 03/28/13   SDarlyne Russian MD     Past Surgical History  Procedure Laterality Date  . Bleeding ulcer      1971  . Cyst excision      forehead     ROS Constitutional: Negative.  HENT: Negative.  Eyes: Negative.  Respiratory: Negative.  Cardiovascular: Negative.  Gastrointestinal: Negative.  Endocrine: Negative.  Genitourinary: Negative.  Musculoskeletal: Negative.  Skin: Negative.  Allergic/Immunologic: Negative.  Neurological: Negative.  Hematological: Negative.  Psychiatric/Behavioral: Negative.    Objective   PE CONSTITUTIONAL: Well developed/well nourished HEAD: Normocephalic/atraumatic EYES: EOMI/PERRL ENMT: Mucous membranes moist NECK: supple no meningeal signs SPINE:entire spine nontender CV: S1/S2 noted, no murmurs/rubs/gallops noted LUNGS: Lungs are clear to auscultation bilaterally, no apparent distress ABDOMEN: soft, nontender, no rebound or guarding GU:no cva tenderness rectal exam reveals a normal prostate no nodularity NEURO: Pt is awake/alert, moves  all extremitiesx4 EXTREMITIES: pulses normal, full ROM SKIN: warm, color normal PSYCH: no abnormalities of mood noted Results for orders placed in visit on 12/10/13  CBC WITH DIFFERENTIAL      Result Value Range   WBC 5.7  4.0 - 10.5 K/uL   RBC 5.36  4.22 - 5.81 MIL/uL   Hemoglobin 15.5  13.0 - 17.0 g/dL   HCT 46.1  39.0 - 52.0 %   MCV 86.0  78.0 - 100.0 fL   MCH 28.9  26.0 - 34.0 pg   MCHC 33.6  30.0 - 36.0 g/dL   RDW 13.8  11.5 - 15.5 %   Platelets 178  150 - 400 K/uL    Neutrophils Relative % 50  43 - 77 %   Neutro Abs 2.8  1.7 - 7.7 K/uL   Lymphocytes Relative 43  12 - 46 %   Lymphs Abs 2.4  0.7 - 4.0 K/uL   Monocytes Relative 6  3 - 12 %   Monocytes Absolute 0.4  0.1 - 1.0 K/uL   Eosinophils Relative 1  0 - 5 %   Eosinophils Absolute 0.1  0.0 - 0.7 K/uL   Basophils Relative 0  0 - 1 %   Basophils Absolute 0.0  0.0 - 0.1 K/uL   Smear Review Criteria for review not met    COMPLETE METABOLIC PANEL WITH GFR      Result Value Range   Sodium    135 - 145 mEq/L   Potassium    3.5 - 5.3 mEq/L   Chloride    96 - 112 mEq/L   CO2    19 - 32 mEq/L   Glucose, Bld    70 - 99 mg/dL   BUN    6 - 23 mg/dL   Creat    0.50 - 1.35 mg/dL   Total Bilirubin    0.3 - 1.2 mg/dL   Alkaline Phosphatase    39 - 117 U/L   AST    0 - 37 U/L   ALT    0 - 53 U/L   Total Protein    6.0 - 8.3 g/dL   Albumin    3.5 - 5.2 g/dL   Calcium    8.4 - 10.5 mg/dL   GFR, Est African American       GFR, Est Non African American      LIPID PANEL      Result Value Range   Cholesterol    0 - 200 mg/dL   Triglycerides    <150 mg/dL   HDL    >39 mg/dL   Total CHOL/HDL Ratio       VLDL    0 - 40 mg/dL   LDL Cholesterol    0 - 99 mg/dL  POCT URINALYSIS DIPSTICK      Result Value Range   Color, UA yellow     Clarity, UA clear     Glucose, UA neg     Bilirubin, UA neg     Ketones, UA neg     Spec Grav, UA 1.010     Blood, UA neg     pH, UA 6.0     Protein, UA neg     Urobilinogen, UA 0.2     Nitrite, UA neg     Leukocytes, UA Negative      Assessment and Plan  Colonoscopy was 08/09/2012. He declined any other vaccinations there were no changes in his medications and they were refilled.  He is currently on amlodipine, pravastatin, and Diovan.    I personally performed the services described in this documentation, which was scribed in my presence. The recorded information has been reviewed and is accurate.

## 2014-01-22 ENCOUNTER — Encounter: Payer: Self-pay | Admitting: *Deleted

## 2014-02-18 DIAGNOSIS — N401 Enlarged prostate with lower urinary tract symptoms: Secondary | ICD-10-CM | POA: Diagnosis not present

## 2014-02-18 DIAGNOSIS — N139 Obstructive and reflux uropathy, unspecified: Secondary | ICD-10-CM | POA: Diagnosis not present

## 2014-02-18 DIAGNOSIS — N138 Other obstructive and reflux uropathy: Secondary | ICD-10-CM | POA: Diagnosis not present

## 2014-02-26 DIAGNOSIS — N138 Other obstructive and reflux uropathy: Secondary | ICD-10-CM | POA: Diagnosis not present

## 2014-02-26 DIAGNOSIS — N401 Enlarged prostate with lower urinary tract symptoms: Secondary | ICD-10-CM | POA: Diagnosis not present

## 2014-02-26 DIAGNOSIS — R972 Elevated prostate specific antigen [PSA]: Secondary | ICD-10-CM | POA: Diagnosis not present

## 2014-02-26 DIAGNOSIS — N139 Obstructive and reflux uropathy, unspecified: Secondary | ICD-10-CM | POA: Diagnosis not present

## 2014-03-04 DIAGNOSIS — K1370 Unspecified lesions of oral mucosa: Secondary | ICD-10-CM | POA: Diagnosis not present

## 2014-03-04 DIAGNOSIS — K1321 Leukoplakia of oral mucosa, including tongue: Secondary | ICD-10-CM | POA: Diagnosis not present

## 2014-03-20 DIAGNOSIS — H4011X0 Primary open-angle glaucoma, stage unspecified: Secondary | ICD-10-CM | POA: Diagnosis not present

## 2014-07-15 DIAGNOSIS — H4011X0 Primary open-angle glaucoma, stage unspecified: Secondary | ICD-10-CM | POA: Diagnosis not present

## 2014-08-15 DIAGNOSIS — H4011X1 Primary open-angle glaucoma, mild stage: Secondary | ICD-10-CM | POA: Diagnosis not present

## 2014-08-27 DIAGNOSIS — R972 Elevated prostate specific antigen [PSA]: Secondary | ICD-10-CM | POA: Diagnosis not present

## 2014-09-04 DIAGNOSIS — R972 Elevated prostate specific antigen [PSA]: Secondary | ICD-10-CM | POA: Diagnosis not present

## 2014-09-04 DIAGNOSIS — R351 Nocturia: Secondary | ICD-10-CM | POA: Diagnosis not present

## 2014-09-04 DIAGNOSIS — N401 Enlarged prostate with lower urinary tract symptoms: Secondary | ICD-10-CM | POA: Diagnosis not present

## 2014-09-16 DIAGNOSIS — H4011X1 Primary open-angle glaucoma, mild stage: Secondary | ICD-10-CM | POA: Diagnosis not present

## 2014-11-27 DIAGNOSIS — H4011X1 Primary open-angle glaucoma, mild stage: Secondary | ICD-10-CM | POA: Diagnosis not present

## 2014-12-18 ENCOUNTER — Other Ambulatory Visit: Payer: Self-pay | Admitting: Emergency Medicine

## 2014-12-30 DIAGNOSIS — H4011X1 Primary open-angle glaucoma, mild stage: Secondary | ICD-10-CM | POA: Diagnosis not present

## 2015-01-16 ENCOUNTER — Ambulatory Visit (INDEPENDENT_AMBULATORY_CARE_PROVIDER_SITE_OTHER): Payer: Medicare Other | Admitting: Emergency Medicine

## 2015-01-16 ENCOUNTER — Ambulatory Visit (INDEPENDENT_AMBULATORY_CARE_PROVIDER_SITE_OTHER): Payer: Medicare Other

## 2015-01-16 VITALS — BP 128/68 | HR 59 | Temp 97.6°F | Resp 16 | Wt 165.4 lb

## 2015-01-16 DIAGNOSIS — M79642 Pain in left hand: Secondary | ICD-10-CM | POA: Diagnosis not present

## 2015-01-16 DIAGNOSIS — I10 Essential (primary) hypertension: Secondary | ICD-10-CM

## 2015-01-16 DIAGNOSIS — E785 Hyperlipidemia, unspecified: Secondary | ICD-10-CM | POA: Diagnosis not present

## 2015-01-16 MED ORDER — VALSARTAN 160 MG PO TABS: ORAL_TABLET | ORAL | Status: DC

## 2015-01-16 MED ORDER — PRAVASTATIN SODIUM 40 MG PO TABS: 40.0000 mg | ORAL_TABLET | Freq: Every day | ORAL | Status: DC

## 2015-01-16 MED ORDER — AMLODIPINE BESYLATE 10 MG PO TABS: ORAL_TABLET | ORAL | Status: DC

## 2015-01-16 NOTE — Progress Notes (Addendum)
   Subjective:    Patient ID: Justin Walter, male    DOB: Mar 31, 1946, 69 y.o.   MRN: [id number] This chart was scribed for Arlyss Queen, MD by Zola Button, Medical Scribe. This patient was seen in room 12 and the patient's care was started at 12:38 PM.   HPI HPI Comments: Justin Walter is a 69 y.o. male with a hx of HTN and HLD who presents to the [facility] for a medication refill. Patient states he has been doing well. His last visit with me was in January of 2015.   Patient also reports having a bump on the left side of his cheek that was noticed about 4 months ago. He initially thought it was a blackhead.  Patient also hurt his left wrist while playing golf a few months ago in November 2015. He has wrist pain with swelling. He thinks he may have hit a golf ball incorrectly. Patient does not remember any specific injuries. He did not play golf again for the next three months, but he has started playing again recently as the weather has warmed up. He started taking tumeric with black pepper last week, which he states has helped with his pain.    Review of Systems  Musculoskeletal: Positive for arthralgias.       Objective:   Physical Exam CONSTITUTIONAL: Well developed/well nourished HEAD: Normocephalic/atraumatic EYES: EOM/PERRL ENMT: Mucous membranes moist NECK: supple no meningeal signs SPINE: entire spine nontender CV: S1/S2 noted, no murmurs/rubs/gallops noted LUNGS: Lungs are clear to auscultation bilaterally, no apparent distress ABDOMEN: soft, nontender, no rebound or guarding GU: no cva tenderness NEURO: Pt is awake/alert, moves all extremitiesx4 EXTREMITIES: pulses normal, full ROM SKIN: warm, color normal. There is a 4 mm verrucous lesion mid left cheek PSYCH: no abnormalities of mood noted  [facility] reading (PRIMARY) by  Dr.Bralon Antkowiak there is lot of arthritic change at the base of the thumb. PROCEDURE NOTE: The verrucous lesion on the left cheek was  treated with liquid nitrogen for 8 seconds 2 patient tolerated well     Assessment & Plan:   Meds were refilled. The wartlike area on the left cheek was treated with cryotherapy for 8 seconds 2 he was encouraged to make a full physical exam appointment.I personally performed the services described in this documentation, which was scribed in my presence. The recorded information has been reviewed and is accurate.

## 2015-02-12 DIAGNOSIS — H4011X1 Primary open-angle glaucoma, mild stage: Secondary | ICD-10-CM | POA: Diagnosis not present

## 2015-03-04 DIAGNOSIS — R972 Elevated prostate specific antigen [PSA]: Secondary | ICD-10-CM | POA: Diagnosis not present

## 2015-03-05 ENCOUNTER — Ambulatory Visit (INDEPENDENT_AMBULATORY_CARE_PROVIDER_SITE_OTHER): Payer: Medicare Other | Admitting: Emergency Medicine

## 2015-03-05 ENCOUNTER — Encounter: Payer: Self-pay | Admitting: Emergency Medicine

## 2015-03-05 VITALS — BP 113/67 | HR 54 | Temp 98.1°F | Resp 16 | Ht 66.0 in | Wt 164.6 lb

## 2015-03-05 DIAGNOSIS — R001 Bradycardia, unspecified: Secondary | ICD-10-CM

## 2015-03-05 DIAGNOSIS — Z0000 Encounter for general adult medical examination without abnormal findings: Secondary | ICD-10-CM | POA: Diagnosis not present

## 2015-03-05 DIAGNOSIS — H9190 Unspecified hearing loss, unspecified ear: Secondary | ICD-10-CM | POA: Diagnosis not present

## 2015-03-05 DIAGNOSIS — E785 Hyperlipidemia, unspecified: Secondary | ICD-10-CM

## 2015-03-05 DIAGNOSIS — I10 Essential (primary) hypertension: Secondary | ICD-10-CM | POA: Diagnosis not present

## 2015-03-05 LAB — CBC
HEMATOCRIT: 44.4 % (ref 39.0–52.0)
HEMOGLOBIN: 15.1 g/dL (ref 13.0–17.0)
MCH: 29.2 pg (ref 26.0–34.0)
MCHC: 34 g/dL (ref 30.0–36.0)
MCV: 85.7 fL (ref 78.0–100.0)
MPV: 10.9 fL (ref 8.6–12.4)
Platelets: 197 10*3/uL (ref 150–400)
RBC: 5.18 MIL/uL (ref 4.22–5.81)
RDW: 13.5 % (ref 11.5–15.5)
WBC: 7.5 10*3/uL (ref 4.0–10.5)

## 2015-03-05 LAB — COMPLETE METABOLIC PANEL WITH GFR
ALK PHOS: 69 U/L (ref 39–117)
ALT: 30 U/L (ref 0–53)
AST: 29 U/L (ref 0–37)
Albumin: 4.1 g/dL (ref 3.5–5.2)
BILIRUBIN TOTAL: 0.9 mg/dL (ref 0.2–1.2)
BUN: 17 mg/dL (ref 6–23)
CO2: 23 meq/L (ref 19–32)
CREATININE: 1.12 mg/dL (ref 0.50–1.35)
Calcium: 9.5 mg/dL (ref 8.4–10.5)
Chloride: 106 mEq/L (ref 96–112)
GFR, Est African American: 78 mL/min
GFR, Est Non African American: 68 mL/min
GLUCOSE: 75 mg/dL (ref 70–99)
Potassium: 3.9 mEq/L (ref 3.5–5.3)
Sodium: 138 mEq/L (ref 135–145)
TOTAL PROTEIN: 7.2 g/dL (ref 6.0–8.3)

## 2015-03-05 LAB — LIPID PANEL
CHOL/HDL RATIO: 4.1 ratio
Cholesterol: 177 mg/dL (ref 0–200)
HDL: 43 mg/dL (ref >=40)
LDL CALC: 107 mg/dL — AB (ref 0–99)
TRIGLYCERIDES: 136 mg/dL (ref <150)
VLDL: 27 mg/dL (ref 0–40)

## 2015-03-05 NOTE — Patient Instructions (Signed)
Hives Hives are itchy, red, swollen areas of the skin. They can vary in size and location on your body. Hives can come and go for hours or several days (acute hives) or for several weeks (chronic hives). Hives do not spread from person to person (noncontagious). They may get worse with scratching, exercise, and emotional stress. CAUSES   Allergic reaction to food, additives, or drugs.  Infections, including the common cold.  Illness, such as vasculitis, lupus, or thyroid disease.  Exposure to sunlight, heat, or cold.  Exercise.  Stress.  Contact with chemicals. SYMPTOMS   Red or white swollen patches on the skin. The patches may change size, shape, and location quickly and repeatedly.  Itching.  Swelling of the hands, feet, and face. This may occur if hives develop deeper in the skin. DIAGNOSIS  Your caregiver can usually tell what is wrong by performing a physical exam. Skin or blood tests may also be done to determine the cause of your hives. In some cases, the cause cannot be determined. TREATMENT  Mild cases usually get better with medicines such as antihistamines. Severe cases may require an emergency epinephrine injection. If the cause of your hives is known, treatment includes avoiding that trigger.  HOME CARE INSTRUCTIONS   Avoid causes that trigger your hives.  Take antihistamines as directed by your caregiver to reduce the severity of your hives. Non-sedating or low-sedating antihistamines are usually recommended. Do not drive while taking an antihistamine.  Take any other medicines prescribed for itching as directed by your caregiver.  Wear loose-fitting clothing.  Keep all follow-up appointments as directed by your caregiver. SEEK MEDICAL CARE IF:   You have persistent or severe itching that is not relieved with medicine.  You have painful or swollen joints. SEEK IMMEDIATE MEDICAL CARE IF:   You have a fever.  Your tongue or lips are swollen.  You have  trouble breathing or swallowing.  You feel tightness in the throat or chest.  You have abdominal pain. These problems may be the first sign of a life-threatening allergic reaction. Call your local emergency services (911 in [place] MAKE SURE YOU:   Understand these instructions.  Will watch your condition.  Will get help right away if you are not doing well or get worse. Document Released: 10/31/2005 Document Revised: 11/05/2013 Document Reviewed: 01/24/2012 Exit[employer] Patient Information 2015 [employer]. This information is not intended to replace advice given to you by your health care provider. Make sure you discuss any questions you have with your health care provider.  

## 2015-03-05 NOTE — Progress Notes (Signed)
Subjective:    Patient ID: Justin Walter, male    DOB: 1946/03/16, 69 y.o.   MRN: [id number] This chart was scribed for Arlyss Queen, MD by Zola Button, Medical Scribe. This patient was seen in room 21 and the patient's care was started at 9:47 AM.   HPI HPI Comments: Justin Walter is a 69 y.o. male with a hx of HTN and HLD who presents to the [facility] for an annual exam.   Patient states he has been doing well overall. He notes that the swelling to his left wrist has decreased since last time; his pain has improved as well, noting very little pain at this time. He has still been taking turmeric for the pain with relief.  Patient does not take baby aspirin. He had a bleeding stomach ulcer when he was in his early 50s, then had a flare-up of it a few years later. He has been doing fine on his medications. His last colonoscopy was about 2 years ago. He has had his shingles vaccines and the flu vaccine. He had the pneumovax vaccine last year. His last tetanus was in 2014. He sees a urologist, Dr. Junious Silk, and has an appointment with him next week; he gets his prostate checked with Dr. Junious Silk. He had a recent eye exam; it was found that his eye pressure had significantly dropped. The cause is unknown, but the only thing he has been doing differently is taking turmeric. He has not had a hearing test done recently. Patient denies hearing loss. He did not work in a loud environment. Patient does yoga, light weight lifting, exercise bike, and golf for exercise.  Patient worked in Engineer, maintenance (IT).  Review of Systems Reviewed per patient health survey.     Objective:   Physical Exam CONSTITUTIONAL: Well developed/well nourished HEAD: Normocephalic/atraumatic EYES: EOM/PERRL ENMT: Mucous membranes moist NECK: supple no meningeal signs SPINE: entire spine nontender CV: S1/S2 noted, no murmurs/rubs/gallops noted LUNGS: Lungs are clear to auscultation bilaterally, no  apparent distress ABDOMEN: soft, nontender, no rebound or guarding GU: no cva tenderness NEURO: Pt is awake/alert, moves all extremitiesx4 EXTREMITIES: pulses normal, full ROM SKIN: 3 cm x 4 cm red, raised area, distal lateral right arm. PSYCH: no abnormalities of mood noted EKG sinus bradycardia  Results for orders placed or performed in visit on 12/10/13  CBC with Differential  Result Value Ref Range   WBC 5.7 4.0 - 10.5 K/uL   RBC 5.36 4.22 - 5.81 MIL/uL   Hemoglobin 15.5 13.0 - 17.0 g/dL   HCT 46.1 39.0 - 52.0 %   MCV 86.0 78.0 - 100.0 fL   MCH 28.9 26.0 - 34.0 pg   MCHC 33.6 30.0 - 36.0 g/dL   RDW 13.8 11.5 - 15.5 %   Platelets 178 150 - 400 K/uL   Neutrophils Relative % 50 43 - 77 %   Neutro Abs 2.8 1.7 - 7.7 K/uL   Lymphocytes Relative 43 12 - 46 %   Lymphs Abs 2.4 0.7 - 4.0 K/uL   Monocytes Relative 6 3 - 12 %   Monocytes Absolute 0.4 0.1 - 1.0 K/uL   Eosinophils Relative 1 0 - 5 %   Eosinophils Absolute 0.1 0.0 - 0.7 K/uL   Basophils Relative 0 0 - 1 %   Basophils Absolute 0.0 0.0 - 0.1 K/uL   Smear Review Criteria for review not met   COMPLETE METABOLIC PANEL WITH GFR  Result Value Ref Range   Sodium  141 135 - 145 mEq/L   Potassium 4.4 3.5 - 5.3 mEq/L   Chloride 105 96 - 112 mEq/L   CO2 28 19 - 32 mEq/L   Glucose, Bld 85 70 - 99 mg/dL   BUN 15 6 - 23 mg/dL   Creat 1.30 0.50 - 1.35 mg/dL   Total Bilirubin 1.0 0.3 - 1.2 mg/dL   Alkaline Phosphatase 65 39 - 117 U/L   AST 31 0 - 37 U/L   ALT 30 0 - 53 U/L   Total Protein 7.7 6.0 - 8.3 g/dL   Albumin 4.4 3.5 - 5.2 g/dL   Calcium 10.0 8.4 - 10.5 mg/dL   GFR, Est African American 66 mL/min   GFR, Est Non African American 57 (L) mL/min  Lipid panel  Result Value Ref Range   Cholesterol 187 0 - 200 mg/dL   Triglycerides 195 (H) <150 mg/dL   HDL 44 >39 mg/dL   Total CHOL/HDL Ratio 4.3 Ratio   VLDL 39 0 - 40 mg/dL   LDL Cholesterol 104 (H) 0 - 99 mg/dL  POCT urinalysis dipstick  Result Value Ref Range    Color, UA yellow    Clarity, UA clear    Glucose, UA neg    Bilirubin, UA neg    Ketones, UA neg    Spec Grav, UA 1.010    Blood, UA neg    pH, UA 6.0    Protein, UA neg    Urobilinogen, UA 0.2    Nitrite, UA neg    Leukocytes, UA Negative        Assessment & Plan:  This patient is compliant on medications. Blood pressure under great control cholesterol at goal. Referral made to cardiology for an opinion regarding his bradycardia.I personally performed the services described in this documentation, which was scribed in my presence. The recorded information has been reviewed and is accurate.

## 2015-03-06 ENCOUNTER — Encounter: Payer: Self-pay | Admitting: Family Medicine

## 2015-03-11 DIAGNOSIS — R972 Elevated prostate specific antigen [PSA]: Secondary | ICD-10-CM | POA: Diagnosis not present

## 2015-03-11 DIAGNOSIS — N400 Enlarged prostate without lower urinary tract symptoms: Secondary | ICD-10-CM | POA: Diagnosis not present

## 2015-03-25 DIAGNOSIS — I10 Essential (primary) hypertension: Secondary | ICD-10-CM | POA: Diagnosis not present

## 2015-03-25 DIAGNOSIS — E780 Pure hypercholesterolemia: Secondary | ICD-10-CM | POA: Diagnosis not present

## 2015-03-25 DIAGNOSIS — Z136 Encounter for screening for cardiovascular disorders: Secondary | ICD-10-CM | POA: Diagnosis not present

## 2015-04-20 DIAGNOSIS — Z136 Encounter for screening for cardiovascular disorders: Secondary | ICD-10-CM | POA: Diagnosis not present

## 2015-04-29 DIAGNOSIS — H4011X1 Primary open-angle glaucoma, mild stage: Secondary | ICD-10-CM | POA: Diagnosis not present

## 2015-08-04 DIAGNOSIS — H4011X1 Primary open-angle glaucoma, mild stage: Secondary | ICD-10-CM | POA: Diagnosis not present

## 2015-08-18 ENCOUNTER — Encounter: Payer: Self-pay | Admitting: Emergency Medicine

## 2015-09-08 ENCOUNTER — Telehealth: Payer: Self-pay | Admitting: Family Medicine

## 2015-09-08 NOTE — Telephone Encounter (Signed)
Spoke with patient about his flu shot.  He does not take it.   So I postponed it until he comes in for his physical early 2017.

## 2015-09-14 DIAGNOSIS — R972 Elevated prostate specific antigen [PSA]: Secondary | ICD-10-CM | POA: Diagnosis not present

## 2015-09-16 ENCOUNTER — Other Ambulatory Visit: Payer: Self-pay | Admitting: Urology

## 2015-09-16 DIAGNOSIS — R972 Elevated prostate specific antigen [PSA]: Secondary | ICD-10-CM | POA: Diagnosis not present

## 2015-09-16 DIAGNOSIS — N400 Enlarged prostate without lower urinary tract symptoms: Secondary | ICD-10-CM | POA: Diagnosis not present

## 2015-10-02 ENCOUNTER — Ambulatory Visit (HOSPITAL_COMMUNITY)
Admission: RE | Admit: 2015-10-02 | Discharge: 2015-10-02 | Disposition: A | Discharge status: Home / Self Care | Payer: Medicare Other | Source: Ambulatory Visit | Attending: Urology | Admitting: Urology

## 2015-10-02 DIAGNOSIS — K5730 Diverticulosis of large intestine without perforation or abscess without bleeding: Secondary | ICD-10-CM | POA: Insufficient documentation

## 2015-10-02 DIAGNOSIS — N402 Nodular prostate without lower urinary tract symptoms: Secondary | ICD-10-CM | POA: Insufficient documentation

## 2015-10-02 DIAGNOSIS — R972 Elevated prostate specific antigen [PSA]: Secondary | ICD-10-CM | POA: Diagnosis not present

## 2015-10-02 MED ORDER — GADOBENATE DIMEGLUMINE 529 MG/ML IV SOLN
15.0000 mL | Freq: Once | INTRAVENOUS | Status: AC | PRN
  Administered 2015-10-02: 15 mL via INTRAVENOUS

## 2015-10-14 DIAGNOSIS — H40013 Open angle with borderline findings, low risk, bilateral: Secondary | ICD-10-CM | POA: Diagnosis not present

## 2016-01-13 DIAGNOSIS — H40013 Open angle with borderline findings, low risk, bilateral: Secondary | ICD-10-CM | POA: Diagnosis not present

## 2016-01-18 ENCOUNTER — Other Ambulatory Visit: Payer: Self-pay | Admitting: Emergency Medicine

## 2016-01-19 ENCOUNTER — Telehealth: Payer: Self-pay

## 2016-01-19 NOTE — Telephone Encounter (Signed)
Pt is very upset that his medication request has been denied and that he needs to come in for a appt because he has had a PE and due for another PE in May   Best number [phone] Pt is hoping to get a call back with in an hour    I labeled this high priority due to the patient needing a quick response

## 2016-01-19 NOTE — Telephone Encounter (Signed)
Rx's sent.Left message letting pt know.

## 2016-03-08 ENCOUNTER — Encounter: Payer: Medicare Other | Admitting: Emergency Medicine

## 2016-03-17 ENCOUNTER — Telehealth: Payer: Self-pay

## 2016-03-17 DIAGNOSIS — I10 Essential (primary) hypertension: Secondary | ICD-10-CM

## 2016-03-17 DIAGNOSIS — E785 Hyperlipidemia, unspecified: Secondary | ICD-10-CM

## 2016-03-17 DIAGNOSIS — Z125 Encounter for screening for malignant neoplasm of prostate: Secondary | ICD-10-CM

## 2016-03-17 NOTE — Telephone Encounter (Signed)
Lease order CBC, comprehensive metabolic panel, lipid panel, Medicare PSA,and TSH. He has enough diagnosis to associate this with. Please place the orders as a future order to be done on the date requested.

## 2016-03-17 NOTE — Telephone Encounter (Signed)
Dr. Everlene Farrier   Patient is requesting lab orders prior to his appointment with you on May 11.   He would like to come in on Tuesday May 9.  Can you put the orders in the system?

## 2016-03-18 NOTE — Telephone Encounter (Signed)
Future orders pended.

## 2016-03-19 ENCOUNTER — Other Ambulatory Visit (INDEPENDENT_AMBULATORY_CARE_PROVIDER_SITE_OTHER): Payer: Medicare Other

## 2016-03-19 DIAGNOSIS — E785 Hyperlipidemia, unspecified: Secondary | ICD-10-CM

## 2016-03-19 DIAGNOSIS — Z125 Encounter for screening for malignant neoplasm of prostate: Secondary | ICD-10-CM

## 2016-03-19 DIAGNOSIS — I10 Essential (primary) hypertension: Secondary | ICD-10-CM

## 2016-03-19 LAB — LIPID PANEL
CHOLESTEROL: 168 mg/dL (ref 125–200)
HDL: 42 mg/dL (ref >=40)
LDL Cholesterol: 92 mg/dL (ref <130)
TRIGLYCERIDES: 168 mg/dL — AB (ref <150)
Total CHOL/HDL Ratio: 4 Ratio (ref <=5.0)
VLDL: 34 mg/dL — ABNORMAL HIGH (ref <30)

## 2016-03-19 LAB — CBC WITH DIFFERENTIAL/PLATELET
BASOS PCT: 1 %
Basophils Absolute: 56 cells/uL (ref 0–200)
EOS ABS: 56 {cells}/uL (ref 15–500)
Eosinophils Relative: 1 %
HEMATOCRIT: 47.3 % (ref 38.5–50.0)
Hemoglobin: 15.6 g/dL (ref 13.2–17.1)
LYMPHS ABS: 2240 {cells}/uL (ref 850–3900)
LYMPHS PCT: 40 %
MCH: 29.4 pg (ref 27.0–33.0)
MCHC: 33 g/dL (ref 32.0–36.0)
MCV: 89.2 fL (ref 80.0–100.0)
MONO ABS: 336 {cells}/uL (ref 200–950)
MPV: 11.3 fL (ref 7.5–12.5)
Monocytes Relative: 6 %
Neutro Abs: 2912 cells/uL (ref 1500–7800)
Neutrophils Relative %: 52 %
Platelets: 195 10*3/uL (ref 140–400)
RBC: 5.3 MIL/uL (ref 4.20–5.80)
RDW: 13.9 % (ref 11.0–15.0)
WBC: 5.6 10*3/uL (ref 3.8–10.8)

## 2016-03-19 LAB — TSH: TSH: 2.25 mIU/L (ref 0.40–4.50)

## 2016-03-19 LAB — COMPREHENSIVE METABOLIC PANEL
ALBUMIN: 4.4 g/dL (ref 3.6–5.1)
ALK PHOS: 64 U/L (ref 40–115)
ALT: 24 U/L (ref 9–46)
AST: 28 U/L (ref 10–35)
BILIRUBIN TOTAL: 1.2 mg/dL (ref 0.2–1.2)
BUN: 14 mg/dL (ref 7–25)
CALCIUM: 9.9 mg/dL (ref 8.6–10.3)
CO2: 24 mmol/L (ref 20–31)
Chloride: 108 mmol/L (ref 98–110)
Creat: 1.09 mg/dL (ref 0.70–1.25)
Glucose, Bld: 89 mg/dL (ref 65–99)
POTASSIUM: 4.4 mmol/L (ref 3.5–5.3)
Sodium: 139 mmol/L (ref 135–146)
Total Protein: 7.6 g/dL (ref 6.1–8.1)

## 2016-03-21 LAB — PSA, MEDICARE: PSA: 8.76 ng/mL — ABNORMAL HIGH (ref <=4.00)

## 2016-03-22 ENCOUNTER — Other Ambulatory Visit: Payer: Self-pay | Admitting: *Deleted

## 2016-03-22 DIAGNOSIS — R972 Elevated prostate specific antigen [PSA]: Secondary | ICD-10-CM

## 2016-03-24 ENCOUNTER — Encounter: Payer: Self-pay | Admitting: Emergency Medicine

## 2016-03-24 ENCOUNTER — Ambulatory Visit (INDEPENDENT_AMBULATORY_CARE_PROVIDER_SITE_OTHER): Payer: Medicare Other | Admitting: Emergency Medicine

## 2016-03-24 VITALS — BP 140/78 | HR 56 | Temp 97.6°F | Resp 16 | Ht 67.0 in | Wt 166.4 lb

## 2016-03-24 DIAGNOSIS — Z1159 Encounter for screening for other viral diseases: Secondary | ICD-10-CM

## 2016-03-24 DIAGNOSIS — I10 Essential (primary) hypertension: Secondary | ICD-10-CM

## 2016-03-24 DIAGNOSIS — Z23 Encounter for immunization: Secondary | ICD-10-CM | POA: Diagnosis not present

## 2016-03-24 DIAGNOSIS — Z0000 Encounter for general adult medical examination without abnormal findings: Secondary | ICD-10-CM | POA: Diagnosis not present

## 2016-03-24 DIAGNOSIS — E785 Hyperlipidemia, unspecified: Secondary | ICD-10-CM

## 2016-03-24 LAB — POCT URINALYSIS DIP (MANUAL ENTRY)
BILIRUBIN UA: NEGATIVE
BILIRUBIN UA: NEGATIVE
Blood, UA: NEGATIVE
Glucose, UA: NEGATIVE
LEUKOCYTES UA: NEGATIVE
Nitrite, UA: NEGATIVE
PH UA: 5.5
Protein Ur, POC: NEGATIVE
Spec Grav, UA: <=1.005
Urobilinogen, UA: 0.2

## 2016-03-24 MED ORDER — PRAVASTATIN SODIUM 40 MG PO TABS: 40.0000 mg | ORAL_TABLET | Freq: Every day | ORAL | Status: DC

## 2016-03-24 MED ORDER — VALSARTAN 160 MG PO TABS: ORAL_TABLET | ORAL | Status: DC

## 2016-03-24 MED ORDER — AMLODIPINE BESYLATE 10 MG PO TABS: 10.0000 mg | ORAL_TABLET | Freq: Every day | ORAL | Status: DC

## 2016-03-24 NOTE — Progress Notes (Signed)
By signing my name below, I, Mesha Guinyard, attest that this documentation has been prepared under the direction and in the presence of Arlyss Queen, MD.  Electronically Signed: Verlee Monte, Medical Scribe. 03/24/2016. 8:35 AM.  Chief Complaint:  Chief Complaint  Patient presents with  . Annual Exam    HPI: Justin Walter is a 70 y.o. male with a hx of HTN and HLD reports to [facility] today for an annual exam. Pt reports his hand was swollen while he was golfing, and went to get it checked. Pt mentions he's been doing well overall. Pt goes to urologist regularly twice year, and gets his blood checked there. He mentions his PSA levels have been stable and he has been following it very closely. Pt mentions he exercises 3 days for 3 hours every week and golf's on the weekends. Pt denies having a hx of smoking. Pt mentions he had his shingles vaccine. Pt reports having ulcer that bled when he was 49, and ended up bleeding again.  Pt mentions he is retired.  Past Medical History  Diagnosis Date  . Hypertension   . Hyperlipidemia   . Ulcer   . Glaucoma   . Blood transfusion without reported diagnosis    Past Surgical History  Procedure Laterality Date  . Bleeding ulcer      1971  . Cyst excision      forehead   Social History   Social History  . Marital Status: Divorced    Spouse Name: N/A  . Number of Children: N/A  . Years of Education: N/A   Occupational History  . Retired    Social History Main Topics  . Smoking status: Never Smoker   . Smokeless tobacco: None  . Alcohol Use: 1.0 oz/week    2 drink(s) per week     Comment: occass  . Drug Use: No  . Sexual Activity: Yes   Other Topics Concern  . None   Social History Narrative   Divorced. Education: Grade School. Exercise: Yes.   Family History  Problem Relation Age of Onset  . Cancer Mother     pancreatic cancer  . Stroke Father   . Cancer Sister    Allergies  Allergen Reactions  . Lotrel [Amlodipine  Besy-Benazepril Hcl] Swelling   Prior to Admission medications   Medication Sig Start Date End Date Taking? Authorizing Provider  amLODipine (NORVASC) 10 MG tablet TAKE 1 TABLET BY MOUTH DAILY 01/19/16  Yes Darlyne Russian, MD  bimatoprost (LUMIGAN) 0.03 % ophthalmic solution 1 drop at bedtime.   Yes Historical Provider, MD  desonide (DESOWEN) 0.05 % cream Apply topically 2 (two) times daily. 12/10/13  Yes Darlyne Russian, MD  dorzolamide (TRUSOPT) 2 % ophthalmic solution 1 drop 3 (three) times daily.   Yes Historical Provider, MD  pravastatin (PRAVACHOL) 40 MG tablet TAKE 1 TABLET BY MOUTH DAILY 01/19/16  Yes Darlyne Russian, MD  valsartan (DIOVAN) 160 MG tablet TAKE 1 TABLET BY MOUTH EVERY DAY. "OV NEEDED FOR ADDITIONAL REFILLS" 01/19/16  Yes Darlyne Russian, MD  zoster vaccine live, PF, (ZOSTAVAX) 60454 UNT/0.65ML injection Inject 19,400 Units into the skin once. 12/10/13  Yes Darlyne Russian, MD    ROS: The patient denies fevers, chills, night sweats, unintentional weight loss, chest pain, palpitations, wheezing, dyspnea on exertion, nausea, vomiting, abdominal pain, dysuria, hematuria, melena, numbness, weakness, or tingling.  All other systems have been reviewed and were otherwise negative with the exception of those mentioned in  the HPI and as above.    PHYSICAL EXAM: Filed Vitals:   03/24/16 0832 03/24/16 0837  BP: 144/81 140/78  Pulse: 56   Temp: 97.6 F (36.4 C)   Resp: 16    BP reading on right arm: 126/82  BP reading on left arm: 120/78  Body mass index is 26.06 kg/(m^2).  General: Alert, no acute distress HEENT:  Normocephalic, atraumatic, oropharynx patent. Eye: Juliette Mangle [facility] Cardiovascular:  Regular rate and rhythm, no rubs murmurs or gallops.  No Carotid bruits, radial pulse intact. No pedal edema.  Respiratory: Clear to auscultation bilaterally.  No wheezes, rales, or rhonchi.  No cyanosis, no use of accessory musculature Abdominal: No organomegaly, abdomen is soft and  non-tender, positive bowel sounds.  No masses. Musculoskeletal: Gait intact. No edema, tenderness Skin: No rashes. Neurologic: Facial musculature symmetric. Psychiatric: Patient acts appropriately throughout our interaction. Lymphatic: No cervical or submandibular lymphadenopathy  Prostate exam deferred due to pt seeing urologist twice a year.  LABS: Results for orders placed or performed in visit on 03/24/16  POCT urinalysis dipstick  Result Value Ref Range   Color, UA yellow yellow   Clarity, UA clear clear   Glucose, UA negative negative   Bilirubin, UA negative negative   Ketones, POC UA negative negative   Spec Grav, UA <=1.005    Blood, UA negative negative   pH, UA 5.5    Protein Ur, POC negative negative   Urobilinogen, UA 0.2    Nitrite, UA Negative Negative   Leukocytes, UA Negative Negative   EKG/XRAY:   Primary read interpreted by Dr. Everlene Farrier at [facility].   ASSESSMENT/PLAN: This patient has a very healthy lifestyle. He has had a rising PSA and will discuss management with the urologist. Overall his lab work looks good. His triglycerides were slightly elevated and he will work on diet and exercise. I did refill his blood pressure medication and statin. We did not put him on an aspirin a day because he had ulcer surgery in his 34s for a bleeding ulcer and I was hesitant to do this.I personally performed the services described in this documentation, which was scribed in my presence. The recorded information has been reviewed and is accurate.   Gross sideeffects, risk and benefits, and alternatives of medications d/w patient. Patient is aware that all medications have potential sideeffects and we are unable to predict every sideeffect or drug-drug interaction that may occur.  Arlyss Queen MD 03/24/2016 8:34 AM

## 2016-03-24 NOTE — Patient Instructions (Signed)
     IF you received an x-ray today, you will receive an invoice from [facility]. Please contact [facility] at [phone] with questions or concerns regarding your invoice.   IF you received labwork today, you will receive an invoice from [facility]. Please contact [facility] at [phone] with questions or concerns regarding your invoice.   Our billing staff will not be able to assist you with questions regarding bills from these companies.  You will be contacted with the lab results as soon as they are available. The fastest way to get your results is to activate your My Chart account. Instructions are located on the last page of this paperwork. If you have not heard from us regarding the results in 2 weeks, please contact this office.      

## 2016-03-25 LAB — HEPATITIS C ANTIBODY: HCV AB: NEGATIVE

## 2016-04-18 DIAGNOSIS — H40013 Open angle with borderline findings, low risk, bilateral: Secondary | ICD-10-CM | POA: Diagnosis not present

## 2016-06-29 DIAGNOSIS — N400 Enlarged prostate without lower urinary tract symptoms: Secondary | ICD-10-CM | POA: Diagnosis not present

## 2016-07-06 DIAGNOSIS — R972 Elevated prostate specific antigen [PSA]: Secondary | ICD-10-CM | POA: Diagnosis not present

## 2016-07-13 ENCOUNTER — Other Ambulatory Visit: Payer: Self-pay

## 2016-07-21 DIAGNOSIS — R972 Elevated prostate specific antigen [PSA]: Secondary | ICD-10-CM | POA: Diagnosis not present

## 2016-08-10 DIAGNOSIS — H40013 Open angle with borderline findings, low risk, bilateral: Secondary | ICD-10-CM | POA: Diagnosis not present

## 2016-10-26 IMAGING — MR MR PROSTATE WO/W CM
23 of 53 series · 23 of 53 positions shown · IV contrast (yes)
Comparison: Prostate MRI 08/14/2013

CLINICAL DATA: Elevated PSA.  Prior negative biopsy.

EXAM:
MR PROSTATE WITHOUT AND WITH CONTRAST
TECHNIQUE: Multiplanar multisequence MRI images were obtained of the pelvis
centered about the prostate. Pre and post contrast images were
obtained.
CONTRAST:  15mL MULTIHANCE GADOBENATE DIMEGLUMINE 529 MG/ML IV SOLN

[Series 3: bSSFP fat-sat · axial · 6.0mm · 0.86mm/px · 1 of 44 slices shown]
[im 1/44]
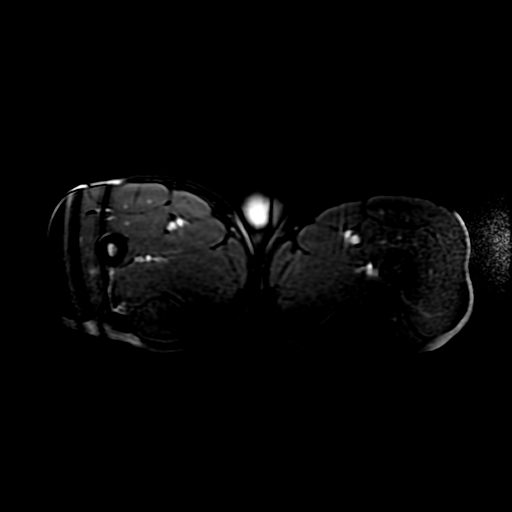

[Series 4: T1 · axial · 6.0mm · 0.86mm/px · 1 of 44 slices shown (1 of 2)]
[im 1/44]
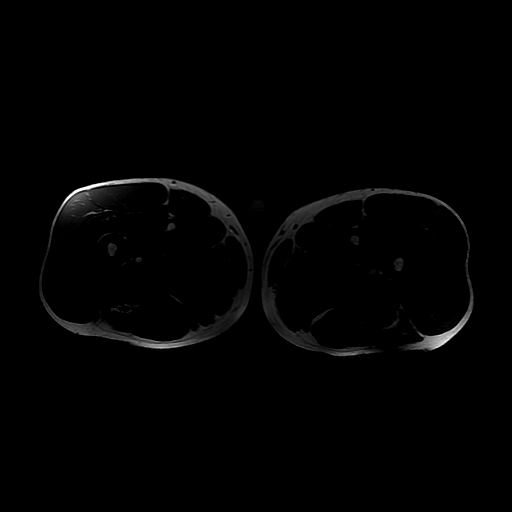

[Series 5: T2 · axial · 3.0mm · 0.29mm/px · 1 of 30 slices shown (1 of 3)]
[im 1/30]
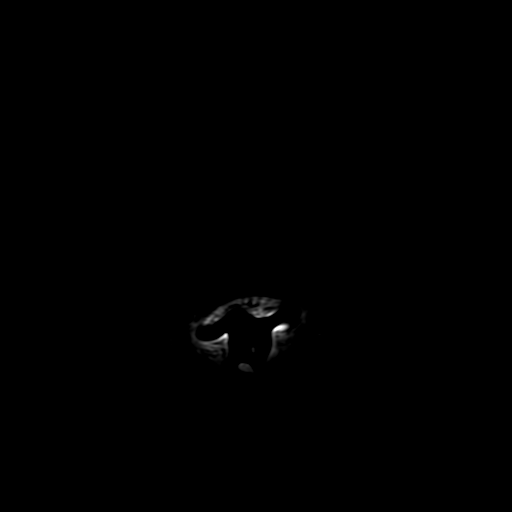

[Series 6: T1 · axial · 3.0mm · 0.29mm/px · 1 of 30 slices shown (2 of 2)]
[im 1/30]
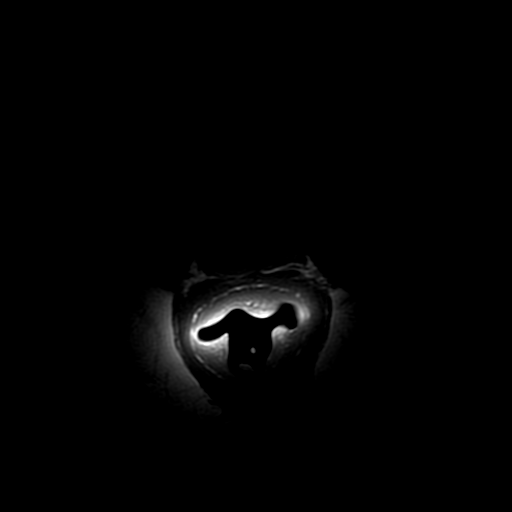

[Series 7: T2 · sagittal · 4.0mm · 0.29mm/px · 1 of 25 slices shown (2 of 3)]
[im 1/25]
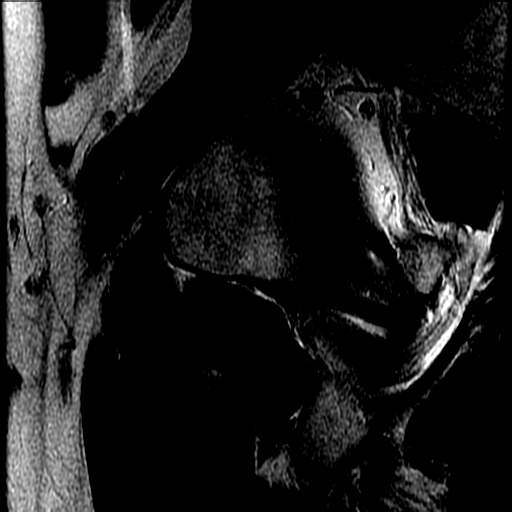

[Series 8: T2 · coronal · 4.0mm · 0.29mm/px · 1 of 21 slices shown (3 of 3)]
[im 1/21]
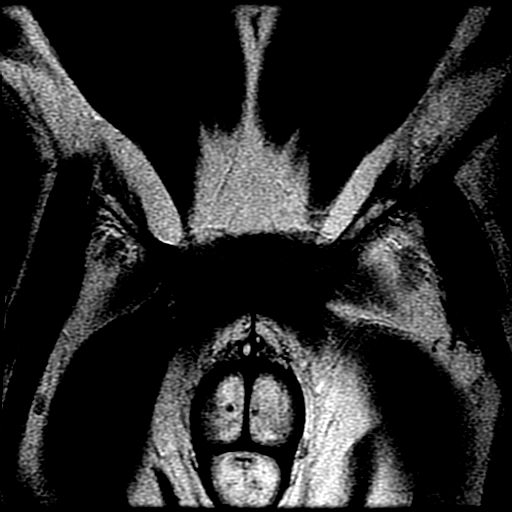

[Series 9: DWI · axial · 3.0mm · 0.59mm/px · 1 of 60 slices shown (1 of 6)]
[im 1/60]
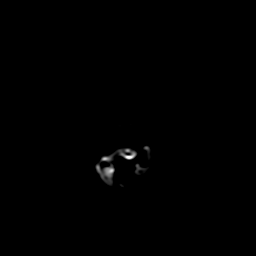

[Series 10: DWI · axial · 3.0mm · 0.59mm/px · 1 of 59 slices shown (2 of 6)]
[im 1/59]
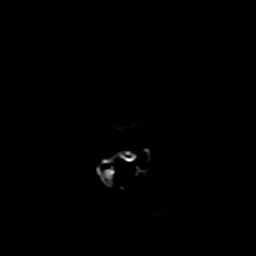

[Series 11: DWI · axial · 3.0mm · 0.59mm/px · 1 of 59 slices shown (3 of 6)]
[im 1/59]
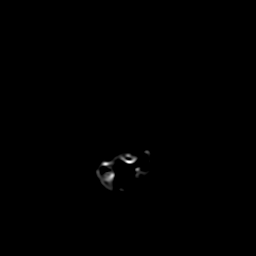

[Series 900: DWI · axial · 3.0mm · 0.59mm/px · 1 of 30 slices shown (4 of 6)]
[im 1/30]
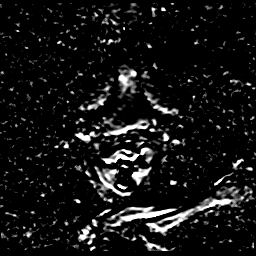

[Series 1000: DWI · axial · 3.0mm · 0.59mm/px · 1 of 30 slices shown (5 of 6)]
[im 1/30]
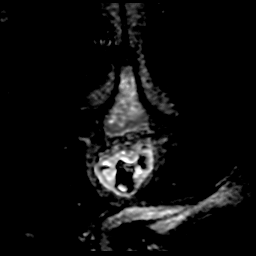

[Series 1100: DWI · axial · 3.0mm · 0.59mm/px · 1 of 30 slices shown (6 of 6)]
[im 1/30]
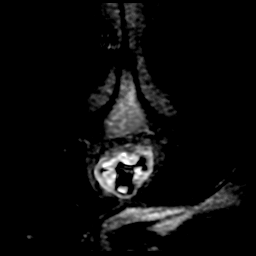

[((id)/(id)/1)-((id)/(id)/1) · axial · 3.0mm · 0.43mm/px · 1 of 82 slices shown (1 of 11)]
[im 1/82]
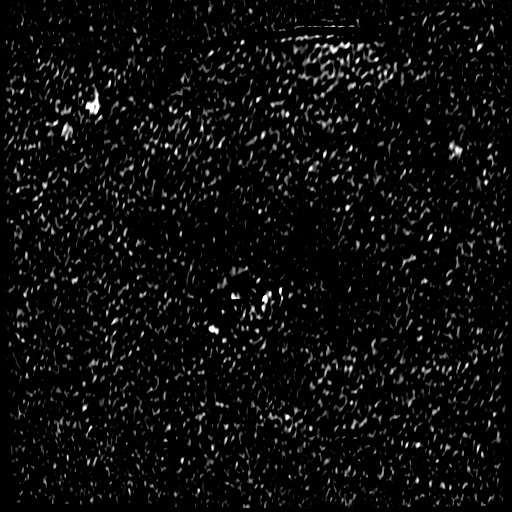

[((id)/(id)/1)-((id)/(id)/1) · axial · 3.0mm · 0.43mm/px · 1 of 83 slices shown (2 of 11)]
[im 1/83]
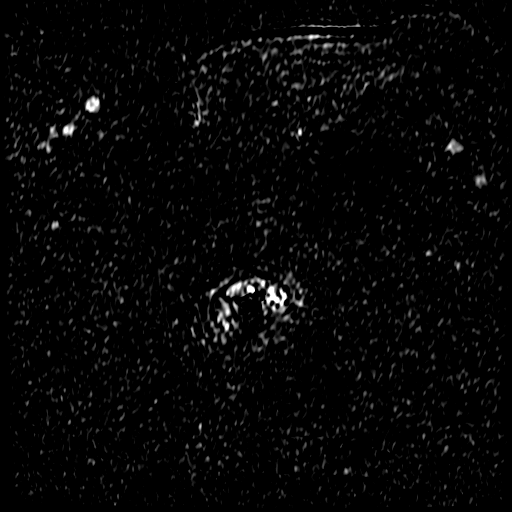

[((id)/(id)/1)-((id)/(id)/1) · axial · 3.0mm · 0.43mm/px · 1 of 84 slices shown (3 of 11)]
[im 1/84]
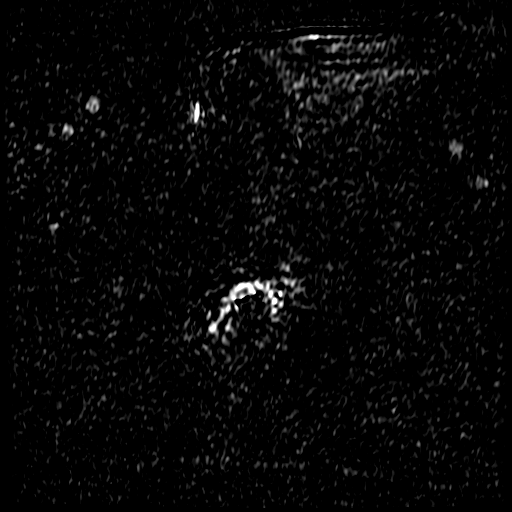

[((id)/(id)/1)-((id)/(id)/1) · axial · 3.0mm · 0.43mm/px · 1 of 84 slices shown (4 of 11)]
[im 1/84]
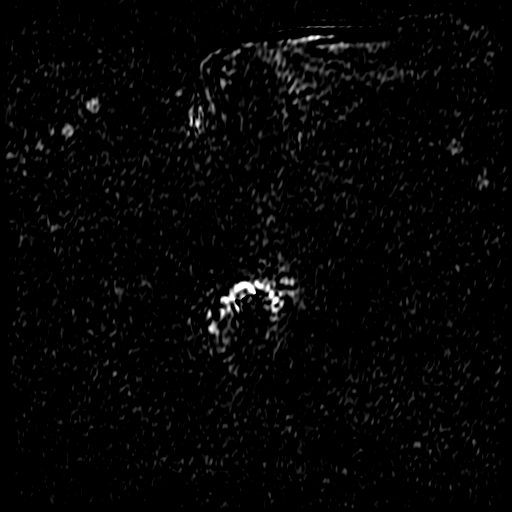

[((id)/(id)/1)-((id)/(id)/1) · axial · 3.0mm · 0.43mm/px · 1 of 84 slices shown (5 of 11)]
[im 1/84]
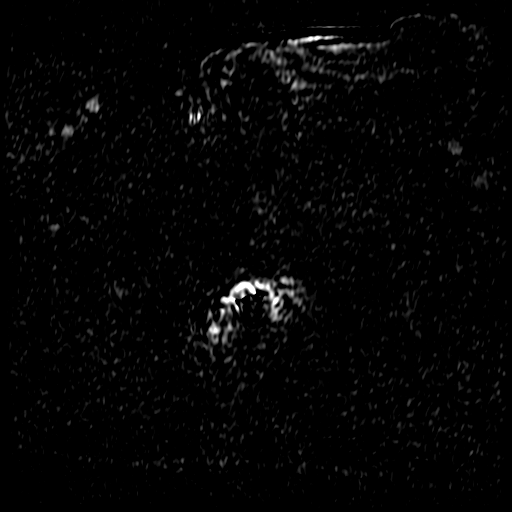

[((id)/(id)/1)-((id)/(id)/1) · axial · 3.0mm · 0.43mm/px · 1 of 84 slices shown (6 of 11)]
[im 1/84]
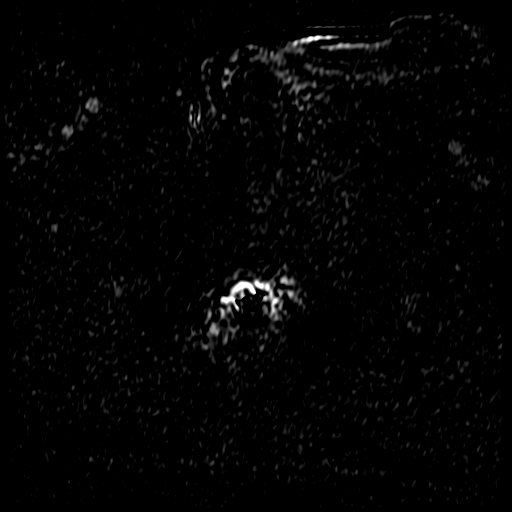

[((id)/(id)/1)-((id)/(id)/1) · axial · 3.0mm · 0.43mm/px · 1 of 84 slices shown (7 of 11)]
[im 1/84]
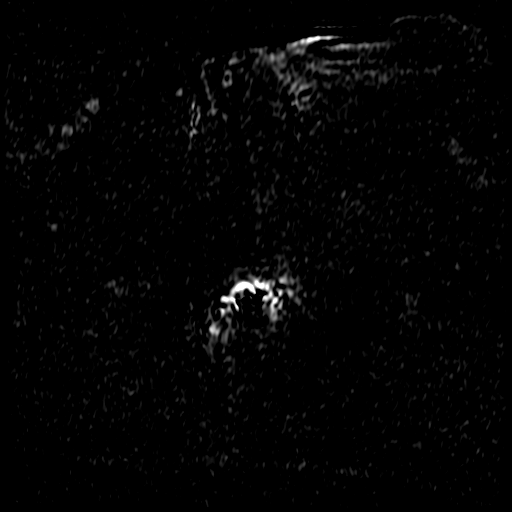

[((id)/(id)/1)-((id)/(id)/1) · axial · 3.0mm · 0.43mm/px · 1 of 84 slices shown (8 of 11)]
[im 1/84]
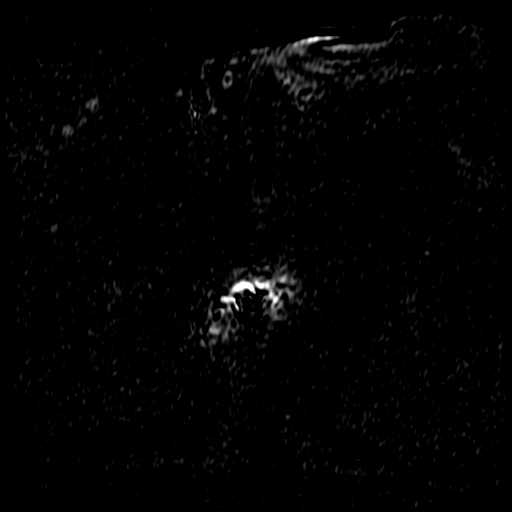

[((id)/(id)/1)-((id)/(id)/1) · axial · 3.0mm · 0.43mm/px · 1 of 84 slices shown (9 of 11)]
[im 1/84]
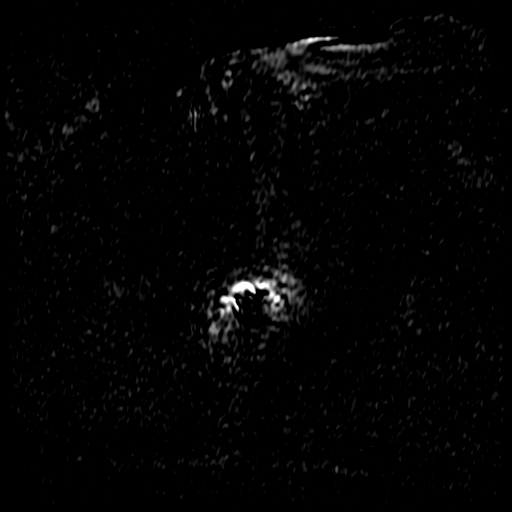

[((id)/(id)/1)-((id)/(id)/1) · axial · 3.0mm · 0.43mm/px · 1 of 84 slices shown (10 of 11)]
[im 1/84]
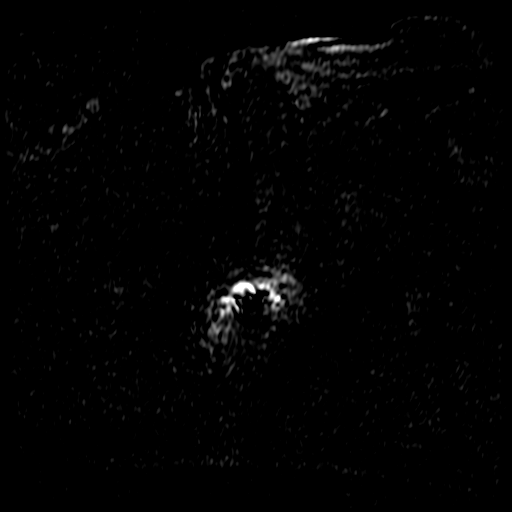

[((id)/(id)/1)-((id)/(id)/1) · axial · 3.0mm · 0.43mm/px · 1 of 84 slices shown (11 of 11)]
[im 1/84]
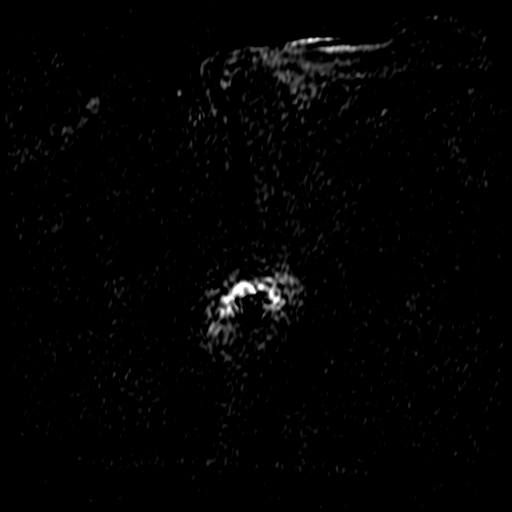

[23 of 53 positions shown; findings below may reference images not displayed]

FINDINGS: Prostate: Prostate measures 53 mm by 30 mm by 44 mm.

LEFT cysts homogeneous high T2 signal within the peripheral zone on
T2 weighted images.

There is again demonstrated a the region of low signal intensity on
the diffusion imaging ADC map at the prostatic base asymmetrically
enlarged on the LEFT (image 18, series 2299). This region of
hypointensity measures 8 mm which is similar to 11 mm on comparison
exam and not increased in size. This region of loss signal intensity
appears less prominent than prior. No additional regions of
restricted diffusion in the peripheral zone.

There are multiple encapsulated nodular lesions of differing
heterogeneity within the transitional zone consistent with benign
prostate hypertrophy.

The prostatic capsule is intact. The neural vascular bundle is
normal.

Transcapsular spread:  Absent

Seminal vesicle involvement: Absent

Neurovascular bundle involvement: Absent

Pelvic adenopathy: Absent

Bone metastasis: Maps

Other findings: Multiple diverticula of the sigmoid colon. Normal
bladder.
IMPRESSION: 1. No clear evidence of prostate carcinoma.
2. Stable diffusion abnormality at the base of the gland compared to
MRI of 08/14/2013. Differential would include benign stromal tissue
of the central gland versus low-grade carcinoma.
3. Benign appearing encapsulated nodules within the transitional
zone consistent with benign prostate hypertrophy.

## 2016-11-23 DIAGNOSIS — H2513 Age-related nuclear cataract, bilateral: Secondary | ICD-10-CM | POA: Diagnosis not present

## 2016-11-23 DIAGNOSIS — H52223 Regular astigmatism, bilateral: Secondary | ICD-10-CM | POA: Diagnosis not present

## 2016-11-23 DIAGNOSIS — H524 Presbyopia: Secondary | ICD-10-CM | POA: Diagnosis not present

## 2016-11-23 DIAGNOSIS — H5203 Hypermetropia, bilateral: Secondary | ICD-10-CM | POA: Diagnosis not present

## 2016-11-23 DIAGNOSIS — H401122 Primary open-angle glaucoma, left eye, moderate stage: Secondary | ICD-10-CM | POA: Diagnosis not present

## 2016-11-23 DIAGNOSIS — H40013 Open angle with borderline findings, low risk, bilateral: Secondary | ICD-10-CM | POA: Diagnosis not present

## 2016-11-23 DIAGNOSIS — H01119 Allergic dermatitis of unspecified eye, unspecified eyelid: Secondary | ICD-10-CM | POA: Diagnosis not present

## 2016-12-15 ENCOUNTER — Ambulatory Visit (INDEPENDENT_AMBULATORY_CARE_PROVIDER_SITE_OTHER): Payer: Medicare Other | Admitting: Family Medicine

## 2016-12-15 ENCOUNTER — Encounter: Payer: Self-pay | Admitting: Family Medicine

## 2016-12-15 VITALS — BP 132/82 | HR 52 | Temp 98.0°F | Ht 67.0 in

## 2016-12-15 DIAGNOSIS — E785 Hyperlipidemia, unspecified: Secondary | ICD-10-CM | POA: Diagnosis not present

## 2016-12-15 DIAGNOSIS — I10 Essential (primary) hypertension: Secondary | ICD-10-CM | POA: Diagnosis not present

## 2016-12-15 NOTE — Patient Instructions (Signed)
It was good to see you today- let me know if you need anything prior to your physical. Let's see you in the spring or early summer.  Let me know if you need any refills in the meantim.

## 2016-12-15 NOTE — Progress Notes (Signed)
[place] at [facility] [address] [place], [place] [address] [phone]  Date:  12/15/2016   Name:  Justin Walter   DOB:  1946/04/09   MRN:  [id number]  PCP:  Lamar Blinks, MD    Chief Complaint: Establish Care (Former pt of Dr. Everlene Farrier. Here to est care. )   History of Present Illness:  Justin Walter is a 71 y.o. very pleasant male patient who presents with the following: Here today asa new pt to me as his prior PCP Dr. Everlene Farrier is retiring  Retired in 2009, in Art therapist.   He is enjoying retirement and feeling very well  Occasionally checks B/P at home.  Normally exercising 3 times per week & yoga 3 times per weeks.  Playing golf a lot (rides golf cart).    Sees Dr. Jodi Mourning (Optometrist) in [facility] manages his glaucoma.  Eye pressure up at last visit.  Left eye pressure is slightly higher.  Sees Dr. Jodi Mourning every 3 months or so.  Last visual field, slight peripheral visual field loss.  Diagnosed in Glaucoma around 2003-2004. He does not have any pain  [facility], sees them every 6 months.  Had prostate biopsy and everything was fine at that time. Never dx with prostate cancer  He "does not do flu shots."    Last CPE was in may of last year He does not have any other concerns today  Patient Active Problem List   Diagnosis Date Noted  . Glaucoma 09/03/2013  . HYPERLIPIDEMIA 01/20/2010  . HYPERTENSION 01/20/2010  . 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    Past Medical History:  Diagnosis Date  . Blood transfusion without reported diagnosis   . Glaucoma   . Hyperlipidemia   . Hypertension   . Ulcer [facility])     Past Surgical History:  Procedure Laterality Date  . bleeding ulcer     1971  . CYST EXCISION     forehead    Social History  Substance Use Topics  . Smoking status: Never Smoker  . Smokeless tobacco: Never Used  . Alcohol use 1.0 oz/week    2 Standard drinks or equivalent per week     Comment:  occass    Family History  Problem Relation Age of Onset  . Cancer Mother     pancreatic cancer  . Stroke Father   . Cancer Sister     Allergies  Allergen Reactions  . Lotrel [Amlodipine Besy-Benazepril Hcl] Swelling    Medication list has been reviewed and updated.  Current Outpatient Prescriptions on File Prior to Visit  Medication Sig Dispense Refill  . amLODipine (NORVASC) 10 MG tablet Take 1 tablet (10 mg total) by mouth daily. 90 tablet 3  . bimatoprost (LUMIGAN) 0.03 % ophthalmic solution 1 drop at bedtime.    Marland Kitchen desonide (DESOWEN) 0.05 % cream Apply topically 2 (two) times daily. 15 g 1  . dorzolamide (TRUSOPT) 2 % ophthalmic solution 1 drop 3 (three) times daily.    . pravastatin (PRAVACHOL) 40 MG tablet Take 1 tablet (40 mg total) by mouth daily. 90 tablet 3  . valsartan (DIOVAN) 160 MG tablet TAKE 1 TABLET BY MOUTH EVERY DAY. " 90 tablet 3  . zoster vaccine live, PF, (ZOSTAVAX) 09811 UNT/0.65ML injection Inject 19,400 Units into the skin once. 1 each 0   No current facility-administered medications on file prior to visit.     Review of Systems:  As per HPI- otherwise negative.  Physical Examination: Vitals:   12/15/16 1440  BP: 132/82  Pulse: (!) 52  Temp: 98 F (36.7 C)   Vitals:   12/15/16 1440  Height: 5\' 7"  (1.702 m)   There is no height or weight on file to calculate BMI. Ideal Body Weight: Weight in (lb) to have BMI = 25: 159.3  GEN: WDWN, NAD, Non-toxic, A & O x 3, normal weight, looks well HEENT: Atraumatic, Normocephalic. Neck supple. No masses, No LAD. Ears and Nose: No external deformity. CV: RRR, No M/G/R. No JVD. No thrill. No extra heart sounds. PULM: CTA B, no wheezes, crackles, rhonchi. No retractions. No resp. distress. No accessory muscle use. EXTR: No c/c/e NEURO Normal gait.  PSYCH: Normally interactive. Conversant. Not depressed or anxious appearing.  Calm demeanor.    Assessment and Plan: Hyperlipidemia, unspecified  hyperlipidemia type  Essential hypertension  Here today as a new patient to establish care His BP is well controlled with current regimen- continue diovan and amlodipine He is tolerating his lipid medications well He is active and getting plenty of exercise- encouraged him to keep this up He will see me for a CPE in a few months   Signed Lamar Blinks, MD

## 2016-12-15 NOTE — Progress Notes (Signed)
Pre visit review using our clinic review tool, if applicable. No additional management support is needed unless otherwise documented below in the visit note. 

## 2017-01-13 DIAGNOSIS — R972 Elevated prostate specific antigen [PSA]: Secondary | ICD-10-CM | POA: Diagnosis not present

## 2017-01-19 DIAGNOSIS — R972 Elevated prostate specific antigen [PSA]: Secondary | ICD-10-CM | POA: Diagnosis not present

## 2017-01-19 DIAGNOSIS — N400 Enlarged prostate without lower urinary tract symptoms: Secondary | ICD-10-CM | POA: Diagnosis not present

## 2017-02-22 DIAGNOSIS — H401111 Primary open-angle glaucoma, right eye, mild stage: Secondary | ICD-10-CM | POA: Diagnosis not present

## 2017-02-22 DIAGNOSIS — H2513 Age-related nuclear cataract, bilateral: Secondary | ICD-10-CM | POA: Diagnosis not present

## 2017-02-22 DIAGNOSIS — H401122 Primary open-angle glaucoma, left eye, moderate stage: Secondary | ICD-10-CM | POA: Diagnosis not present

## 2017-02-22 DIAGNOSIS — H524 Presbyopia: Secondary | ICD-10-CM | POA: Diagnosis not present

## 2017-02-22 DIAGNOSIS — H52223 Regular astigmatism, bilateral: Secondary | ICD-10-CM | POA: Diagnosis not present

## 2017-02-22 DIAGNOSIS — H40013 Open angle with borderline findings, low risk, bilateral: Secondary | ICD-10-CM | POA: Diagnosis not present

## 2017-02-22 DIAGNOSIS — H01119 Allergic dermatitis of unspecified eye, unspecified eyelid: Secondary | ICD-10-CM | POA: Diagnosis not present

## 2017-02-22 DIAGNOSIS — H5203 Hypermetropia, bilateral: Secondary | ICD-10-CM | POA: Diagnosis not present

## 2017-03-24 NOTE — Progress Notes (Deleted)
Subjective:   Justin Walter is a 71 y.o. male who presents for Medicare Annual/Subsequent preventive examination.  Review of Systems:  No ROS.  Medicare Wellness Visit.    Sleep patterns:  Home Safety/Smoke Alarms:   Living environment; residence and Firearm Safety:  Seat Belt Safety/Bike Helmet: Wears seat belt.   Counseling:   Eye Exam-  Dental-  Male:   CCS- Last 08/07/12:  Diverticulosis. Polyps removed. No pathology on file. Repeat 5-10 yrs per report.  PSA-  Lab Results  Component Value Date   PSA 8.76 (H) 03/19/2016        Objective:    Vitals: There were no vitals taken for this visit.  There is no height or weight on file to calculate BMI.  Tobacco History  Smoking Status  . Never Smoker  Smokeless Tobacco  . Never Used     Counseling given: Not Answered   Past Medical History:  Diagnosis Date  . Blood transfusion without reported diagnosis   . Glaucoma   . Hyperlipidemia   . Hypertension   . Ulcer [facility])    Past Surgical History:  Procedure Laterality Date  . bleeding ulcer     1971  . CYST EXCISION     forehead   Family History  Problem Relation Age of Onset  . Cancer Mother        pancreatic cancer  . Stroke Father   . Cancer Sister    History  Sexual Activity  . Sexual activity: Yes    Outpatient Encounter Prescriptions as of 03/29/2017  Medication Sig  . amLODipine (NORVASC) 10 MG tablet Take 1 tablet (10 mg total) by mouth daily.  . bimatoprost (LUMIGAN) 0.03 % ophthalmic solution 1 drop at bedtime.  Marland Kitchen desonide (DESOWEN) 0.05 % cream Apply topically 2 (two) times daily.  . dorzolamide (TRUSOPT) 2 % ophthalmic solution 1 drop 3 (three) times daily.  . pravastatin (PRAVACHOL) 40 MG tablet Take 1 tablet (40 mg total) by mouth daily.  . valsartan (DIOVAN) 160 MG tablet TAKE 1 TABLET BY MOUTH EVERY DAY. "  . zoster vaccine live, PF, (ZOSTAVAX) 43329 UNT/0.65ML injection Inject 19,400 Units into the skin once.   No  facility-administered encounter medications on file as of 03/29/2017.     Activities of Daily Living No flowsheet data found.  Patient Care Team: Copland, Gay Filler, MD as PCP - General (Family Medicine)   Assessment:    Physical assessment deferred to PCP.  Exercise Activities and Dietary recommendations   Diet (meal preparation, eat out, water intake, caffeinated beverages, dairy products, fruits and vegetables): {Desc; diets:16563} Breakfast: Lunch:  Dinner:      Goals    None     Fall Risk Fall Risk  07/13/2016 03/05/2015 12/10/2013  Falls in the past year? No No No   Depression Screen PHQ 2/9 Scores 03/05/2015 12/10/2013  PHQ - 2 Score 0 0    Cognitive Function        Immunization History  Administered Date(s) Administered  . Pneumococcal Conjugate-13 03/24/2016  . Pneumococcal Polysaccharide-23 12/10/2013  . Tdap 09/03/2013  . Zoster 01/13/2014   Screening Tests Health Maintenance  Topic Date Due  . INFLUENZA VACCINE  06/14/2017  . COLONOSCOPY  08/07/2022  . TETANUS/TDAP  09/04/2023  . Hepatitis C Screening  Completed  . PNA vac Low Risk Adult  Completed      Plan:   ***  I have personally reviewed and noted the following in the patient's chart:   .  Medical and social history . Use of alcohol, tobacco or illicit drugs  . Current medications and supplements . Functional ability and status . Nutritional status . Physical activity . Advanced directives . List of other physicians . Hospitalizations, surgeries, and ER visits in previous 12 months . Vitals . Screenings to include cognitive, depression, and falls . Referrals and appointments  In addition, I have reviewed and discussed with patient certain preventive protocols, quality metrics, and best practice recommendations. A written personalized care plan for preventive services as well as general preventive health recommendations were provided to patient.     Shela Nevin,  [place]  03/24/2017

## 2017-03-29 ENCOUNTER — Encounter: Payer: Self-pay | Admitting: Family Medicine

## 2017-03-29 ENCOUNTER — Ambulatory Visit (INDEPENDENT_AMBULATORY_CARE_PROVIDER_SITE_OTHER): Payer: Medicare Other | Admitting: Family Medicine

## 2017-03-29 VITALS — BP 118/70 | HR 54 | Temp 97.6°F | Ht 66.75 in | Wt 166.4 lb

## 2017-03-29 DIAGNOSIS — Z5181 Encounter for therapeutic drug level monitoring: Secondary | ICD-10-CM | POA: Diagnosis not present

## 2017-03-29 DIAGNOSIS — E785 Hyperlipidemia, unspecified: Secondary | ICD-10-CM

## 2017-03-29 DIAGNOSIS — I10 Essential (primary) hypertension: Secondary | ICD-10-CM | POA: Diagnosis not present

## 2017-03-29 DIAGNOSIS — Z0000 Encounter for general adult medical examination without abnormal findings: Secondary | ICD-10-CM | POA: Diagnosis not present

## 2017-03-29 LAB — COMPREHENSIVE METABOLIC PANEL
ALBUMIN: 4.3 g/dL (ref 3.5–5.2)
ALT: 30 U/L (ref 0–53)
AST: 30 U/L (ref 0–37)
Alkaline Phosphatase: 66 U/L (ref 39–117)
BUN: 14 mg/dL (ref 6–23)
CALCIUM: 9.9 mg/dL (ref 8.4–10.5)
CHLORIDE: 107 meq/L (ref 96–112)
CO2: 23 meq/L (ref 19–32)
CREATININE: 1.12 mg/dL (ref 0.40–1.50)
GFR: 83.56 mL/min (ref >60.00)
Glucose, Bld: 91 mg/dL (ref 70–99)
Potassium: 3.8 mEq/L (ref 3.5–5.1)
Sodium: 137 mEq/L (ref 135–145)
Total Bilirubin: 1 mg/dL (ref 0.2–1.2)
Total Protein: 7.5 g/dL (ref 6.0–8.3)

## 2017-03-29 LAB — LIPID PANEL
CHOL/HDL RATIO: 4
CHOLESTEROL: 152 mg/dL (ref 0–200)
HDL: 39.8 mg/dL (ref >39.00)
LDL CALC: 90 mg/dL (ref 0–99)
NonHDL: 112.64
TRIGLYCERIDES: 113 mg/dL (ref 0.0–149.0)
VLDL: 22.6 mg/dL (ref 0.0–40.0)

## 2017-03-29 LAB — CBC
HCT: 44.3 % (ref 39.0–52.0)
Hemoglobin: 15 g/dL (ref 13.0–17.0)
MCHC: 33.9 g/dL (ref 30.0–36.0)
MCV: 87 fl (ref 78.0–100.0)
PLATELETS: 215 10*3/uL (ref 150.0–400.0)
RBC: 5.09 Mil/uL (ref 4.22–5.81)
RDW: 14.2 % (ref 11.5–15.5)
WBC: 6.6 10*3/uL (ref 4.0–10.5)

## 2017-03-29 MED ORDER — PRAVASTATIN SODIUM 40 MG PO TABS: 40.0000 mg | ORAL_TABLET | Freq: Every day | ORAL | 3 refills | Status: DC

## 2017-03-29 MED ORDER — AMLODIPINE BESYLATE 10 MG PO TABS: 10.0000 mg | ORAL_TABLET | Freq: Every day | ORAL | 3 refills | Status: DC

## 2017-03-29 MED ORDER — VALSARTAN 160 MG PO TABS: ORAL_TABLET | ORAL | 3 refills | Status: DC

## 2017-03-29 NOTE — Progress Notes (Signed)
[place] at [facility] [address] [place], [place] [address] [phone]  Date:  03/29/2017   Name:  Justin Walter   DOB:  Apr 12, 1946   MRN:  [id number]  PCP:  Darreld Mclean, MD    Chief Complaint: Annual Exam (Pt here for CPE  with fasting labs. )   History of Present Illness:  Justin Walter is a 71 y.o. very pleasant male patient who presents with the following:  Here today for his annual exam Former pt of Dr. Everlene Farrier who I saw back in February to establish care He enjoys yoga and golf for exercise He does have glaucoma and sees Dr. Jodi Mourning in [place]. He follows up on a regular basis. He does not have any pain from his glaucoma.   Also a pt of [facility].  They take care of his PSA and prostate exams  History of HTN and hyperlipidemia  Last labs about one year ago He has had both pneumonia vaccines, zostavax and tdap in 2014 Colonoscopy in 2013- he was given a 5 year follow  Hep C screening is done  He is fasting today for his labs He is feeling well.  He continues to enjoy retirement.  "I'm on vacation 24/7."   No problems with CP or SOB while exercising Never a smoker, occasional alcohol   Patient Active Problem List   Diagnosis Date Noted  . Glaucoma 09/03/2013  . HYPERLIPIDEMIA 01/20/2010  . HYPERTENSION 01/20/2010  . 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    Past Medical History:  Diagnosis Date  . Blood transfusion without reported diagnosis   . Glaucoma   . Hyperlipidemia   . Hypertension   . Ulcer [facility])     Past Surgical History:  Procedure Laterality Date  . bleeding ulcer     1971  . CYST EXCISION     forehead    Social History  Substance Use Topics  . Smoking status: Never Smoker  . Smokeless tobacco: Never Used  . Alcohol use 1.0 oz/week    2 Standard drinks or equivalent per week     Comment: occass    Family History  Problem Relation Age of Onset  . Cancer Mother        pancreatic cancer   . Stroke Father   . Cancer Sister     Allergies  Allergen Reactions  . Lotrel [Amlodipine Besy-Benazepril Hcl] Swelling    Medication list has been reviewed and updated.  Current Outpatient Prescriptions on File Prior to Visit  Medication Sig Dispense Refill  . amLODipine (NORVASC) 10 MG tablet Take 1 tablet (10 mg total) by mouth daily. 90 tablet 3  . bimatoprost (LUMIGAN) 0.03 % ophthalmic solution 1 drop at bedtime.    Marland Kitchen desonide (DESOWEN) 0.05 % cream Apply topically 2 (two) times daily. 15 g 1  . dorzolamide (TRUSOPT) 2 % ophthalmic solution 1 drop 3 (three) times daily.    . pravastatin (PRAVACHOL) 40 MG tablet Take 1 tablet (40 mg total) by mouth daily. 90 tablet 3  . valsartan (DIOVAN) 160 MG tablet TAKE 1 TABLET BY MOUTH EVERY DAY. " 90 tablet 3  . zoster vaccine live, PF, (ZOSTAVAX) 02542 UNT/0.65ML injection Inject 19,400 Units into the skin once. 1 each 0   No current facility-administered medications on file prior to visit.     Review of Systems:  As per HPI- otherwise negative. No concerns about his memory at all   Physical Examination: Vitals:  03/29/17 0832  BP: 118/70  Pulse: (!) 54  Temp: 97.6 F (36.4 C)   Vitals:   03/29/17 0832  Weight: 166 lb 6.4 oz (75.5 kg)  Height: 5' 6.75" (1.695 m)   Body mass index is 26.26 kg/m. Ideal Body Weight: Weight in (lb) to have BMI = 25: 158.1  GEN: WDWN, NAD, Non-toxic, A & O x 3, looks well, normal weight HEENT: Atraumatic, Normocephalic. Neck supple. No masses, No LAD.  Bilateral TM wnl, oropharynx normal.  PEERL,EOMI.   Ears and Nose: No external deformity. CV: RRR, No M/G/R. No JVD. No thrill. No extra heart sounds. PULM: CTA B, no wheezes, crackles, rhonchi. No retractions. No resp. distress. No accessory muscle use. ABD: S, NT, ND, +BS. No rebound. No HSM.   Ventral hernia- does not bother him EXTR: No c/c/e NEURO Normal gait.  PSYCH: Normally interactive. Conversant. Not depressed or anxious  appearing.  Calm demeanor.  Nodule- ? Fibroma- on left sided upper back which pt reports has been present for many years and unchanged    Assessment and Plan: Annual physical exam  Hyperlipidemia, unspecified hyperlipidemia type - Plan: pravastatin (PRAVACHOL) 40 MG tablet, Lipid panel  Essential hypertension - Plan: amLODipine (NORVASC) 10 MG tablet, valsartan (DIOVAN) 160 MG tablet  Medication monitoring encounter - Plan: CBC, Comprehensive metabolic panel  Here today for a CPE- feeling well, no concerns His BP is well controlled Refilled his medications, labs pending and will plan follow-up according to lab results Discussed shingrix with him  Signed Lamar Blinks, MD

## 2017-03-29 NOTE — Patient Instructions (Signed)
It was very nice to see you today- take care and I will be in touch with your results asap There is a new shingles vaccine out- Shingrix.  It is superior to the zostavax which you already had.  If you like, you can have Shingrix as well at your convenience.  It is a 2 shot series and can be given at your drug store    Health Maintenance, Male A healthy lifestyle and preventive care is important for your health and wellness. Ask your health care provider about what schedule of regular examinations is right for you. What should I know about weight and diet?  Eat a Healthy Diet  Eat plenty of vegetables, fruits, whole grains, low-fat dairy products, and lean protein.  Do not eat a lot of foods high in solid fats, added sugars, or salt. Maintain a Healthy Weight  Regular exercise can help you achieve or maintain a healthy weight. You should:  Do at least 150 minutes of exercise each week. The exercise should increase your heart rate and make you sweat (moderate-intensity exercise).  Do strength-training exercises at least twice a week. Watch Your Levels of Cholesterol and Blood Lipids  Have your blood tested for lipids and cholesterol every 5 years starting at 69 years of age. If you are at high risk for heart disease, you should start having your blood tested when you are 69 years old. You may need to have your cholesterol levels checked more often if:  Your lipid or cholesterol levels are high.  You are older than 69 years of age.  You are at high risk for heart disease. What should I know about cancer screening? Many types of cancers can be detected early and may often be prevented. Lung Cancer  You should be screened every year for lung cancer if:  You are a current smoker who has smoked for at least 30 years.  You are a former smoker who has quit within the past 15 years.  Talk to your health care provider about your screening options, when you should start screening, and how  often you should be screened. Colorectal Cancer  Routine colorectal cancer screening usually begins at 69 years of age and should be repeated every 5-10 years until you are 69 years old. You may need to be screened more often if early forms of precancerous polyps or small growths are found. Your health care provider may recommend screening at an earlier age if you have risk factors for colon cancer.  Your health care provider may recommend using home test kits to check for hidden blood in the stool.  A small camera at the end of a tube can be used to examine your colon (sigmoidoscopy or colonoscopy). This checks for the earliest forms of colorectal cancer. Prostate and Testicular Cancer  Depending on your age and overall health, your health care provider may do certain tests to screen for prostate and testicular cancer.  Talk to your health care provider about any symptoms or concerns you have about testicular or prostate cancer. Skin Cancer  Check your skin from head to toe regularly.  Tell your health care provider about any new moles or changes in moles, especially if:  There is a change in a mole's size, shape, or color.  You have a mole that is larger than a pencil eraser.  Always use sunscreen. Apply sunscreen liberally and repeat throughout the day.  Protect yourself by wearing long sleeves, pants, a wide-brimmed hat, and sunglasses  when outside. What should I know about heart disease, diabetes, and high blood pressure?  If you are 69-42 years of age, have your blood pressure checked every 3-5 years. If you are 50 years of age or older, have your blood pressure checked every year. You should have your blood pressure measured twice-once when you are at a hospital or clinic, and once when you are not at a hospital or clinic. Record the average of the two measurements. To check your blood pressure when you are not at a hospital or clinic, you can use:  An automated blood pressure  machine at a pharmacy.  A home blood pressure monitor.  Talk to your health care provider about your target blood pressure.  If you are between 25-56 years old, ask your health care provider if you should take aspirin to prevent heart disease.  Have regular diabetes screenings by checking your fasting blood sugar level.  If you are at a normal weight and have a low risk for diabetes, have this test once every three years after the age of 48.  If you are overweight and have a high risk for diabetes, consider being tested at a younger age or more often.  A one-time screening for abdominal aortic aneurysm (AAA) by ultrasound is recommended for men aged 74-75 years who are current or former smokers. What should I know about preventing infection? Hepatitis B  If you have a higher risk for hepatitis B, you should be screened for this virus. Talk with your health care provider to find out if you are at risk for hepatitis B infection. Hepatitis C  Blood testing is recommended for:  Everyone born from [birth year] through 1963.  Anyone with known risk factors for hepatitis C. Sexually Transmitted Diseases (STDs)  You should be screened each year for STDs including gonorrhea and chlamydia if:  You are sexually active and are younger than 69 years of age.  You are older than 69 years of age and your health care provider tells you that you are at risk for this type of infection.  Your sexual activity has changed since you were last screened and you are at an increased risk for chlamydia or gonorrhea. Ask your health care provider if you are at risk.  Talk with your health care provider about whether you are at high risk of being infected with HIV. Your health care provider may recommend a prescription medicine to help prevent HIV infection. What else can I do?  Schedule regular health, dental, and eye exams.  Stay current with your vaccines (immunizations).  Do not use any tobacco products, such  as cigarettes, chewing tobacco, and e-cigarettes. If you need help quitting, ask your health care provider.  Limit alcohol intake to no more than 2 drinks per day. One drink equals 12 ounces of beer, 5 ounces of wine, or 1 ounces of hard liquor.  Do not use street drugs.  Do not share needles.  Ask your health care provider for help if you need support or information about quitting drugs.  Tell your health care provider if you often feel depressed.  Tell your health care provider if you have ever been abused or do not feel safe at home. This information is not intended to replace advice given to you by your health care provider. Make sure you discuss any questions you have with your health care provider. Document Released: 04/28/2008 Document Revised: 06/29/2016 Document Reviewed: 08/04/2015 Elsevier Interactive Patient Education  2017 [employer].

## 2017-06-08 DIAGNOSIS — H401122 Primary open-angle glaucoma, left eye, moderate stage: Secondary | ICD-10-CM | POA: Diagnosis not present

## 2017-06-08 DIAGNOSIS — H2513 Age-related nuclear cataract, bilateral: Secondary | ICD-10-CM | POA: Diagnosis not present

## 2017-06-08 DIAGNOSIS — H01119 Allergic dermatitis of unspecified eye, unspecified eyelid: Secondary | ICD-10-CM | POA: Diagnosis not present

## 2017-06-08 DIAGNOSIS — H401111 Primary open-angle glaucoma, right eye, mild stage: Secondary | ICD-10-CM | POA: Diagnosis not present

## 2017-06-08 DIAGNOSIS — H5203 Hypermetropia, bilateral: Secondary | ICD-10-CM | POA: Diagnosis not present

## 2017-06-08 DIAGNOSIS — H524 Presbyopia: Secondary | ICD-10-CM | POA: Diagnosis not present

## 2017-06-08 DIAGNOSIS — H52223 Regular astigmatism, bilateral: Secondary | ICD-10-CM | POA: Diagnosis not present

## 2017-07-24 DIAGNOSIS — R972 Elevated prostate specific antigen [PSA]: Secondary | ICD-10-CM | POA: Diagnosis not present

## 2017-07-26 DIAGNOSIS — N400 Enlarged prostate without lower urinary tract symptoms: Secondary | ICD-10-CM | POA: Diagnosis not present

## 2017-07-26 DIAGNOSIS — R972 Elevated prostate specific antigen [PSA]: Secondary | ICD-10-CM | POA: Diagnosis not present

## 2017-07-26 DIAGNOSIS — Z86010 Personal history of colonic polyps: Secondary | ICD-10-CM | POA: Diagnosis not present

## 2017-07-26 DIAGNOSIS — H409 Unspecified glaucoma: Secondary | ICD-10-CM | POA: Diagnosis not present

## 2017-07-26 DIAGNOSIS — R55 Syncope and collapse: Secondary | ICD-10-CM | POA: Diagnosis not present

## 2017-08-07 ENCOUNTER — Encounter: Payer: Self-pay | Admitting: Family Medicine

## 2017-08-07 DIAGNOSIS — R972 Elevated prostate specific antigen [PSA]: Secondary | ICD-10-CM | POA: Insufficient documentation

## 2017-08-15 DIAGNOSIS — Z86010 Personal history of colonic polyps: Secondary | ICD-10-CM | POA: Diagnosis not present

## 2017-08-15 DIAGNOSIS — D125 Benign neoplasm of sigmoid colon: Secondary | ICD-10-CM | POA: Diagnosis not present

## 2017-08-15 DIAGNOSIS — D122 Benign neoplasm of ascending colon: Secondary | ICD-10-CM | POA: Diagnosis not present

## 2017-08-15 DIAGNOSIS — K635 Polyp of colon: Secondary | ICD-10-CM | POA: Diagnosis not present

## 2017-08-15 DIAGNOSIS — K5730 Diverticulosis of large intestine without perforation or abscess without bleeding: Secondary | ICD-10-CM | POA: Diagnosis not present

## 2017-08-16 ENCOUNTER — Encounter: Payer: Self-pay | Admitting: Family Medicine

## 2017-08-16 DIAGNOSIS — K5790 Diverticulosis of intestine, part unspecified, without perforation or abscess without bleeding: Secondary | ICD-10-CM | POA: Insufficient documentation

## 2017-08-22 ENCOUNTER — Telehealth: Payer: Self-pay | Admitting: *Deleted

## 2017-08-22 NOTE — Telephone Encounter (Signed)
Received Pathology results from [place]; forwarded to provider/SLS 10/09

## 2017-09-13 DIAGNOSIS — R972 Elevated prostate specific antigen [PSA]: Secondary | ICD-10-CM | POA: Diagnosis not present

## 2017-10-25 DIAGNOSIS — H401122 Primary open-angle glaucoma, left eye, moderate stage: Secondary | ICD-10-CM | POA: Diagnosis not present

## 2018-01-16 DIAGNOSIS — R972 Elevated prostate specific antigen [PSA]: Secondary | ICD-10-CM | POA: Diagnosis not present

## 2018-01-22 ENCOUNTER — Other Ambulatory Visit: Payer: Self-pay | Admitting: Urology

## 2018-01-22 DIAGNOSIS — R972 Elevated prostate specific antigen [PSA]: Secondary | ICD-10-CM

## 2018-01-22 DIAGNOSIS — N400 Enlarged prostate without lower urinary tract symptoms: Secondary | ICD-10-CM | POA: Diagnosis not present

## 2018-02-01 ENCOUNTER — Ambulatory Visit
Admission: RE | Admit: 2018-02-01 | Discharge: 2018-02-01 | Disposition: A | Discharge status: Home / Self Care | Payer: Medicare Other | Source: Ambulatory Visit | Attending: Urology | Admitting: Urology

## 2018-02-01 DIAGNOSIS — C61 Malignant neoplasm of prostate: Secondary | ICD-10-CM | POA: Diagnosis not present

## 2018-02-01 DIAGNOSIS — R972 Elevated prostate specific antigen [PSA]: Secondary | ICD-10-CM

## 2018-02-01 MED ORDER — GADOBENATE DIMEGLUMINE 529 MG/ML IV SOLN
15.0000 mL | Freq: Once | INTRAVENOUS | Status: AC | PRN
  Administered 2018-02-01: 15 mL via INTRAVENOUS

## 2018-02-15 DIAGNOSIS — R972 Elevated prostate specific antigen [PSA]: Secondary | ICD-10-CM | POA: Diagnosis not present

## 2018-02-15 DIAGNOSIS — D075 Carcinoma in situ of prostate: Secondary | ICD-10-CM | POA: Diagnosis not present

## 2018-03-13 ENCOUNTER — Telehealth: Payer: Self-pay | Admitting: Family Medicine

## 2018-03-13 DIAGNOSIS — E785 Hyperlipidemia, unspecified: Secondary | ICD-10-CM

## 2018-03-13 DIAGNOSIS — I10 Essential (primary) hypertension: Secondary | ICD-10-CM

## 2018-03-13 MED ORDER — AMLODIPINE BESYLATE 10 MG PO TABS: 10.0000 mg | ORAL_TABLET | Freq: Every day | ORAL | 0 refills | Status: DC

## 2018-03-13 MED ORDER — PRAVASTATIN SODIUM 40 MG PO TABS: 40.0000 mg | ORAL_TABLET | Freq: Every day | ORAL | 0 refills | Status: DC

## 2018-03-13 MED ORDER — VALSARTAN 160 MG PO TABS: ORAL_TABLET | ORAL | 0 refills | Status: DC

## 2018-03-13 NOTE — Telephone Encounter (Signed)
Copied from [place] [id number]. Topic: Quick Communication - See Telephone Encounter >> Mar 13, 2018 10:40 AM Yvette Rack wrote: CRM for notification. See Telephone encounter for: 03/13/18.  Patient is calling requesting refills on the following medications: Patient has a yearly visit on May 8 at 11am.   pravastatin (PRAVACHOL) 40 MG tablet  amLODipine (NORVASC) 10 MG tablet valsartan (DIOVAN) 160 MG tablet  [id number] - [place], [place] - [address] [place] [address] Phone: [phone] Fax: [phone]

## 2018-03-18 NOTE — Progress Notes (Addendum)
[place] at [facility] [address], [place], [place] [address] [phone]  Date:  03/21/2018   Name:  Justin Walter   DOB:  12-15-1945   MRN:  [id number]  PCP:  Darreld Mclean, MD    Chief Complaint: Annual Exam (Pt here for CPE. )   History of Present Illness:  Justin Walter is a 72 y.o. very pleasant male patient who presents with the following:  Routine follow-up visit today History of HTN, hyperlipidemia, glaucoma Last seen here a year ago:  Here today for his annual exam Former pt of Dr. Everlene Farrier who I saw back in February to establish care He enjoys yoga and golf for exercise He does have glaucoma and sees Dr. Jodi Mourning in [place]. He follows up on a regular basis. He does not have any pain from his glaucoma.   Also a pt of [facility].  They take care of his PSA and prostate exams History of HTN and hyperlipidemia Last labs about one year ago He has had both pneumonia vaccines, zostavax and tdap in 2014 Colonoscopy in 2013- he was given a 5 year follow  Hep C screening is done He is feeling well.  He continues to enjoy retirement.  "I'm on vacation 24/7."   No problems with CP or SOB while exercising Never a smoker, occasional alcohol   Labs:a year ago, PSA is per urology  Colon: 10/18- given 5 years  Immunizations: UTD except for shingrix- we will discuss this next year as he has zostavax in 2015 Optho: Dr. Jodi Mourning His glaucoma is stable, they have moved his checks out to evyer 6 months  He is fasting this am,but he did take his medications   Lab Results  Component Value Date   PSA 8.76 (H) 03/19/2016   BP Readings from Last 3 Encounters:  03/21/18 128/82  03/29/17 118/70  12/15/16 132/82    No CP or SOB Digestion is normal  He did just have an other prostate bx about a month ago- benign.  [facility]- Dr. Junious Silk  Sleeping well Mood is good No falls in the last year  He is overall doing well and  happy with life   Patient Active Problem List   Diagnosis Date Noted  . Diverticulosis 08/16/2017  . Elevated PSA 08/07/2017  . Glaucoma 09/03/2013  . HYPERLIPIDEMIA 01/20/2010  . HYPERTENSION 01/20/2010  . 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    Past Medical History:  Diagnosis Date  . Blood transfusion without reported diagnosis   . Glaucoma   . Hyperlipidemia   . Hypertension   . Ulcer     Past Surgical History:  Procedure Laterality Date  . bleeding ulcer     1971  . CYST EXCISION     forehead    Social History   Tobacco Use  . Smoking status: Never Smoker  . Smokeless tobacco: Never Used  Substance Use Topics  . Alcohol use: Yes    Alcohol/week: 1.0 oz    Types: 2 Standard drinks or equivalent per week    Comment: occass  . Drug use: No    Family History  Problem Relation Age of Onset  . Cancer Mother        pancreatic cancer  . Stroke Father   . Cancer Sister     Allergies  Allergen Reactions  . Lotrel [Amlodipine Besy-Benazepril Hcl] Swelling    Medication list has been reviewed and updated.  Current Outpatient Medications on File  Prior to Visit  Medication Sig Dispense Refill  . amLODipine (NORVASC) 10 MG tablet Take 1 tablet (10 mg total) by mouth daily. 30 tablet 0  . bimatoprost (LUMIGAN) 0.03 % ophthalmic solution 1 drop at bedtime.    Marland Kitchen desonide (DESOWEN) 0.05 % cream Apply topically 2 (two) times daily. 15 g 1  . dorzolamide (TRUSOPT) 2 % ophthalmic solution 1 drop 3 (three) times daily.    . pravastatin (PRAVACHOL) 40 MG tablet Take 1 tablet (40 mg total) by mouth daily. 30 tablet 0  . valsartan (DIOVAN) 160 MG tablet TAKE 1 TABLET BY MOUTH EVERY DAY. " 30 tablet 0  . zoster vaccine live, PF, (ZOSTAVAX) 37106 UNT/0.65ML injection Inject 19,400 Units into the skin once. 1 each 0   No current facility-administered medications on file prior to visit.     Review of Systems:  As per HPI- otherwise negative. No fever or chills No CP or  SOB    Physical Examination: Vitals:   03/21/18 1100  BP: 128/82  Pulse: (!) 55  Temp: 97.9 F (36.6 C)  SpO2: 96%   Vitals:   03/21/18 1100  Weight: 168 lb 3.2 oz (76.3 kg)  Height: 5' 6.75" (1.695 m)   Body mass index is 26.54 kg/m. Ideal Body Weight: Weight in (lb) to have BMI = 25: 158.1  GEN: WDWN, NAD, Non-toxic, A & O x 3, normal weight, looks well  HEENT: Atraumatic, Normocephalic. Neck supple. No masses, No LAD.  Bilateral TM wnl, oropharynx normal.  PEERL,EOMI.   Ears and Nose: No external deformity. CV: RRR, No M/G/R. No JVD. No thrill. No extra heart sounds. PULM: CTA B, no wheezes, crackles, rhonchi. No retractions. No resp. distress. No accessory muscle use. ABD: S, NT, ND, +BS. No rebound. No HSM.  Midline operative incision on his belly with associated ventral hernia  EXTR: No c/c/e NEURO Normal gait.  PSYCH: Normally interactive. Conversant. Not depressed or anxious appearing.  Calm demeanor.    Assessment and Plan: Annual physical exam  Hyperlipidemia, unspecified hyperlipidemia type - Plan: Lipid panel, pravastatin (PRAVACHOL) 40 MG tablet  Essential hypertension - Plan: CBC, amLODipine (NORVASC) 10 MG tablet, valsartan (DIOVAN) 160 MG tablet  Medication monitoring encounter - Plan: Comprehensive metabolic panel  CPE today Labs pending as above BP controlled  Will plan further follow- up pending labs.   Signed Lamar Blinks, MD  Received his labs, letter to pt  Results for orders placed or performed in visit on 03/21/18  CBC  Result Value Ref Range   WBC 7.3 4.0 - 10.5 K/uL   RBC 4.94 4.22 - 5.81 Mil/uL   Platelets 160.0 150.0 - 400.0 K/uL   Hemoglobin 14.9 13.0 - 17.0 g/dL   HCT 43.2 39.0 - 52.0 %   MCV 87.6 78.0 - 100.0 fl   MCHC 34.5 30.0 - 36.0 g/dL   RDW 13.8 11.5 - 15.5 %  Comprehensive metabolic panel  Result Value Ref Range   Sodium 139 135 - 145 mEq/L   Potassium 3.7 3.5 - 5.1 mEq/L   Chloride 107 96 - 112 mEq/L   CO2  24 19 - 32 mEq/L   Glucose, Bld 78 70 - 99 mg/dL   BUN 11 6 - 23 mg/dL   Creatinine, Ser 1.06 0.40 - 1.50 mg/dL   Total Bilirubin 1.2 0.2 - 1.2 mg/dL   Alkaline Phosphatase 64 39 - 117 U/L   AST 27 0 - 37 U/L   ALT 24 0 - 53 U/L  Total Protein 7.3 6.0 - 8.3 g/dL   Albumin 4.1 3.5 - 5.2 g/dL   Calcium 9.2 8.4 - 10.5 mg/dL   GFR 88.79 >60.00 mL/min  Lipid panel  Result Value Ref Range   Cholesterol 132 0 - 200 mg/dL   Triglycerides 177.0 (H) 0.0 - 149.0 mg/dL   HDL 37.60 (L) >39.00 mg/dL   VLDL 35.4 0.0 - 40.0 mg/dL   LDL Cholesterol 59 0 - 99 mg/dL   Total CHOL/HDL Ratio 4    NonHDL 94.52

## 2018-03-21 ENCOUNTER — Encounter: Payer: Self-pay | Admitting: Family Medicine

## 2018-03-21 ENCOUNTER — Ambulatory Visit (INDEPENDENT_AMBULATORY_CARE_PROVIDER_SITE_OTHER): Payer: Medicare Other | Admitting: Family Medicine

## 2018-03-21 VITALS — BP 128/82 | HR 55 | Temp 97.9°F | Ht 66.75 in | Wt 168.2 lb

## 2018-03-21 DIAGNOSIS — I10 Essential (primary) hypertension: Secondary | ICD-10-CM | POA: Diagnosis not present

## 2018-03-21 DIAGNOSIS — Z0000 Encounter for general adult medical examination without abnormal findings: Secondary | ICD-10-CM | POA: Diagnosis not present

## 2018-03-21 DIAGNOSIS — E785 Hyperlipidemia, unspecified: Secondary | ICD-10-CM | POA: Diagnosis not present

## 2018-03-21 DIAGNOSIS — Z5181 Encounter for therapeutic drug level monitoring: Secondary | ICD-10-CM

## 2018-03-21 LAB — COMPREHENSIVE METABOLIC PANEL
ALBUMIN: 4.1 g/dL (ref 3.5–5.2)
ALT: 24 U/L (ref 0–53)
AST: 27 U/L (ref 0–37)
Alkaline Phosphatase: 64 U/L (ref 39–117)
BILIRUBIN TOTAL: 1.2 mg/dL (ref 0.2–1.2)
BUN: 11 mg/dL (ref 6–23)
CALCIUM: 9.2 mg/dL (ref 8.4–10.5)
CO2: 24 meq/L (ref 19–32)
Chloride: 107 mEq/L (ref 96–112)
Creatinine, Ser: 1.06 mg/dL (ref 0.40–1.50)
GFR: 88.79 mL/min (ref >60.00)
GLUCOSE: 78 mg/dL (ref 70–99)
Potassium: 3.7 mEq/L (ref 3.5–5.1)
Sodium: 139 mEq/L (ref 135–145)
Total Protein: 7.3 g/dL (ref 6.0–8.3)

## 2018-03-21 LAB — LIPID PANEL
CHOL/HDL RATIO: 4
Cholesterol: 132 mg/dL (ref 0–200)
HDL: 37.6 mg/dL — AB (ref >39.00)
LDL CALC: 59 mg/dL (ref 0–99)
NONHDL: 94.52
TRIGLYCERIDES: 177 mg/dL — AB (ref 0.0–149.0)
VLDL: 35.4 mg/dL (ref 0.0–40.0)

## 2018-03-21 LAB — CBC
HCT: 43.2 % (ref 39.0–52.0)
Hemoglobin: 14.9 g/dL (ref 13.0–17.0)
MCHC: 34.5 g/dL (ref 30.0–36.0)
MCV: 87.6 fl (ref 78.0–100.0)
PLATELETS: 160 10*3/uL (ref 150.0–400.0)
RBC: 4.94 Mil/uL (ref 4.22–5.81)
RDW: 13.8 % (ref 11.5–15.5)
WBC: 7.3 10*3/uL (ref 4.0–10.5)

## 2018-03-21 MED ORDER — PRAVASTATIN SODIUM 40 MG PO TABS: 40.0000 mg | ORAL_TABLET | Freq: Every day | ORAL | 3 refills | Status: DC

## 2018-03-21 MED ORDER — VALSARTAN 160 MG PO TABS: ORAL_TABLET | ORAL | 3 refills | Status: DC

## 2018-03-21 MED ORDER — AMLODIPINE BESYLATE 10 MG PO TABS: 10.0000 mg | ORAL_TABLET | Freq: Every day | ORAL | 3 refills | Status: DC

## 2018-03-21 NOTE — Patient Instructions (Addendum)
Great to see you today as always!  Have a great rest of the day and I'll be in touch with your labs Your BP is certainly ok- continue medications but no cause for alarm   Health Maintenance, Male A healthy lifestyle and preventive care is important for your health and wellness. Ask your health care provider about what schedule of regular examinations is right for you. What should I know about weight and diet? Eat a Healthy Diet  Eat plenty of vegetables, fruits, whole grains, low-fat dairy products, and lean protein.  Do not eat a lot of foods high in solid fats, added sugars, or salt.  Maintain a Healthy Weight Regular exercise can help you achieve or maintain a healthy weight. You should:  Do at least 150 minutes of exercise each week. The exercise should increase your heart rate and make you sweat (moderate-intensity exercise).  Do strength-training exercises at least twice a week.  Watch Your Levels of Cholesterol and Blood Lipids  Have your blood tested for lipids and cholesterol every 5 years starting at 70 years of age. If you are at high risk for heart disease, you should start having your blood tested when you are 70 years old. You may need to have your cholesterol levels checked more often if: ? Your lipid or cholesterol levels are high. ? You are older than 70 years of age. ? You are at high risk for heart disease.  What should I know about cancer screening? Many types of cancers can be detected early and may often be prevented. Lung Cancer  You should be screened every year for lung cancer if: ? You are a current smoker who has smoked for at least 30 years. ? You are a former smoker who has quit within the past 15 years.  Talk to your health care provider about your screening options, when you should start screening, and how often you should be screened.  Colorectal Cancer  Routine colorectal cancer screening usually begins at 70 years of age and should be repeated  every 5-10 years until you are 70 years old. You may need to be screened more often if early forms of precancerous polyps or small growths are found. Your health care provider may recommend screening at an earlier age if you have risk factors for colon cancer.  Your health care provider may recommend using home test kits to check for hidden blood in the stool.  A small camera at the end of a tube can be used to examine your colon (sigmoidoscopy or colonoscopy). This checks for the earliest forms of colorectal cancer.  Prostate and Testicular Cancer  Depending on your age and overall health, your health care provider may do certain tests to screen for prostate and testicular cancer.  Talk to your health care provider about any symptoms or concerns you have about testicular or prostate cancer.  Skin Cancer  Check your skin from head to toe regularly.  Tell your health care provider about any new moles or changes in moles, especially if: ? There is a change in a mole's size, shape, or color. ? You have a mole that is larger than a pencil eraser.  Always use sunscreen. Apply sunscreen liberally and repeat throughout the day.  Protect yourself by wearing long sleeves, pants, a wide-brimmed hat, and sunglasses when outside.  What should I know about heart disease, diabetes, and high blood pressure?  If you are 70-71 years of age, have your blood pressure checked every  3-5 years. If you are 59 years of age or older, have your blood pressure checked every year. You should have your blood pressure measured twice-once when you are at a hospital or clinic, and once when you are not at a hospital or clinic. Record the average of the two measurements. To check your blood pressure when you are not at a hospital or clinic, you can use: ? An automated blood pressure machine at a pharmacy. ? A home blood pressure monitor.  Talk to your health care provider about your target blood pressure.  If you are  between 72-78 years old, ask your health care provider if you should take aspirin to prevent heart disease.  Have regular diabetes screenings by checking your fasting blood sugar level. ? If you are at a normal weight and have a low risk for diabetes, have this test once every three years after the age of 22. ? If you are overweight and have a high risk for diabetes, consider being tested at a younger age or more often.  A one-time screening for abdominal aortic aneurysm (AAA) by ultrasound is recommended for men aged 55-75 years who are current or former smokers. What should I know about preventing infection? Hepatitis B If you have a higher risk for hepatitis B, you should be screened for this virus. Talk with your health care provider to find out if you are at risk for hepatitis B infection. Hepatitis C Blood testing is recommended for:  Everyone born from [birth year] through 1963.  Anyone with known risk factors for hepatitis C.  Sexually Transmitted Diseases (STDs)  You should be screened each year for STDs including gonorrhea and chlamydia if: ? You are sexually active and are younger than 70 years of age. ? You are older than 70 years of age and your health care provider tells you that you are at risk for this type of infection. ? Your sexual activity has changed since you were last screened and you are at an increased risk for chlamydia or gonorrhea. Ask your health care provider if you are at risk.  Talk with your health care provider about whether you are at high risk of being infected with HIV. Your health care provider may recommend a prescription medicine to help prevent HIV infection.  What else can I do?  Schedule regular health, dental, and eye exams.  Stay current with your vaccines (immunizations).  Do not use any tobacco products, such as cigarettes, chewing tobacco, and e-cigarettes. If you need help quitting, ask your health care provider.  Limit alcohol intake to no  more than 2 drinks per day. One drink equals 12 ounces of beer, 5 ounces of wine, or 1 ounces of hard liquor.  Do not use street drugs.  Do not share needles.  Ask your health care provider for help if you need support or information about quitting drugs.  Tell your health care provider if you often feel depressed.  Tell your health care provider if you have ever been abused or do not feel safe at home. This information is not intended to replace advice given to you by your health care provider. Make sure you discuss any questions you have with your health care provider. Document Released: 04/28/2008 Document Revised: 06/29/2016 Document Reviewed: 08/04/2015 Elsevier Interactive Patient Education  [employer].

## 2018-06-06 DIAGNOSIS — H524 Presbyopia: Secondary | ICD-10-CM | POA: Diagnosis not present

## 2018-06-06 DIAGNOSIS — H401122 Primary open-angle glaucoma, left eye, moderate stage: Secondary | ICD-10-CM | POA: Diagnosis not present

## 2018-08-27 DIAGNOSIS — R972 Elevated prostate specific antigen [PSA]: Secondary | ICD-10-CM | POA: Diagnosis not present

## 2018-10-24 DIAGNOSIS — H401122 Primary open-angle glaucoma, left eye, moderate stage: Secondary | ICD-10-CM | POA: Diagnosis not present

## 2018-12-26 DIAGNOSIS — H401122 Primary open-angle glaucoma, left eye, moderate stage: Secondary | ICD-10-CM | POA: Diagnosis not present

## 2019-03-01 ENCOUNTER — Other Ambulatory Visit: Payer: Self-pay | Admitting: Family Medicine

## 2019-03-01 DIAGNOSIS — I10 Essential (primary) hypertension: Secondary | ICD-10-CM

## 2019-03-01 DIAGNOSIS — E785 Hyperlipidemia, unspecified: Secondary | ICD-10-CM

## 2019-03-21 DIAGNOSIS — Z20828 Contact with and (suspected) exposure to other viral communicable diseases: Secondary | ICD-10-CM | POA: Diagnosis not present

## 2019-03-22 ENCOUNTER — Other Ambulatory Visit: Payer: Self-pay | Admitting: Family Medicine

## 2019-03-22 DIAGNOSIS — I10 Essential (primary) hypertension: Secondary | ICD-10-CM

## 2019-03-24 NOTE — Progress Notes (Signed)
[place] at [facility] [address] [place], [place] [address] [phone]  Date:  03/25/2019   Name:  Justin Walter   DOB:  Jul 24, 1946   MRN:  [id number]  PCP:  Darreld Mclean, MD    Chief Complaint: No chief complaint on file.   History of Present Illness:  Justin Walter is a 73 y.o. very pleasant male patient who presents with the following:  Virtual visit today for a medication recheck Pt location is home Provider location is home  Pt ID confirmed with name and DOB- he gives consent for virtual visit today   Last seen here about one year ago- history of HTN, hyperlipidemia and glaucoma His ophthalmologist is Dr. Jodi Mourning in [place].  He had to cancel a recent appt but this will be rescheduled   He is a urology pt - his most recent appt was last spring and they did a bx at that time. 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  He has not gotten the results back yet No fever or cough -he was apparently tested because he had chills 1 day  Patient Active Problem List   Diagnosis Date Noted  . Diverticulosis 08/16/2017  . Elevated PSA 08/07/2017  . Glaucoma 09/03/2013  . HYPERLIPIDEMIA 01/20/2010  . HYPERTENSION 01/20/2010  . 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    Past Medical History:  Diagnosis Date  . Blood transfusion without reported diagnosis   . Glaucoma   . Hyperlipidemia   . Hypertension   . Ulcer     Past Surgical History:  Procedure Laterality Date  . bleeding ulcer     1971  . CYST EXCISION     forehead    Social History    Tobacco Use  . Smoking status: Never Smoker  . Smokeless tobacco: Never Used  Substance Use Topics  . Alcohol use: Yes    Alcohol/week: 2.0 standard drinks    Types: 2 Standard drinks or equivalent per week    Comment: occass  . Drug use: No    Family History  Problem Relation Age of Onset  . Cancer Mother        pancreatic cancer  . Stroke Father   . Cancer Sister     Allergies  Allergen Reactions  . Lotrel [Amlodipine Besy-Benazepril Hcl] Swelling    Medication list has been reviewed and updated.  Current Outpatient Medications on File Prior to Visit  Medication Sig Dispense Refill  . amLODipine (NORVASC) 10 MG tablet TAKE 1 TABLET BY MOUTH Walter DAY 30 tablet 0  . bimatoprost (LUMIGAN) 0.03 % ophthalmic solution 1 drop at bedtime.    Marland Kitchen desonide (DESOWEN) 0.05 % cream Apply topically 2 (two) times daily. 15 g 1  . dorzolamide (TRUSOPT) 2 % ophthalmic solution 1 drop 3 (three) times daily.    . pravastatin (PRAVACHOL) 40 MG tablet TAKE 1 TABLET BY MOUTH Walter DAY 30 tablet 0  . valsartan (DIOVAN) 160 MG tablet TAKE 1 TABLET BY MOUTH  Walter DAY. 30 tablet 0  . zoster vaccine live, PF, (ZOSTAVAX) 12248 UNT/0.65ML injection Inject 19,400 Units into the skin once. 1 each 0   No current facility-administered medications on file prior to visit.     Review of Systems:  As per HPI- otherwise negative. No fever  Physical Examination: There were no vitals filed for this visit. There were no vitals filed for this visit. There is no height or weight on file to calculate BMI. Ideal Body Weight:    Patient observed over video.  He looks well, his normal self No cough, wheezing, tachypnea or distress is noted He has not checked his BP in some time He had been feeling well   Assessment and Plan: Essential hypertension - Plan: valsartan (DIOVAN) 160 MG tablet, amLODipine (NORVASC) 10 MG tablet, CBC, Comprehensive metabolic panel  Hyperlipidemia, unspecified hyperlipidemia  type - Plan: pravastatin (PRAVACHOL) 40 MG tablet, Lipid panel  Annual physical exam  Glaucoma, unspecified glaucoma type, unspecified laterality  Virtual visit today for routine follow-up. [name] is not checking his blood pressure right now.  Refilled his 2 blood pressure medications. Of note he had an allergic reaction to Lotrel in 2013.  He has been taking his to current blood pressure medications for several years without incident His glaucoma is managed by ophthalmology Health maintenance is up-to-date, except I would recommend Shingrix Ordered labs today, he plans to come in for a lab visit ASAP Otherwise asked for an inpatient recheck visit in 3 to 4 months  Signed Lamar Blinks, MD

## 2019-03-25 ENCOUNTER — Ambulatory Visit (INDEPENDENT_AMBULATORY_CARE_PROVIDER_SITE_OTHER): Payer: Medicare Other | Admitting: Family Medicine

## 2019-03-25 ENCOUNTER — Other Ambulatory Visit: Payer: Self-pay

## 2019-03-25 ENCOUNTER — Encounter: Payer: Self-pay | Admitting: Family Medicine

## 2019-03-25 DIAGNOSIS — E785 Hyperlipidemia, unspecified: Secondary | ICD-10-CM | POA: Diagnosis not present

## 2019-03-25 DIAGNOSIS — I10 Essential (primary) hypertension: Secondary | ICD-10-CM | POA: Diagnosis not present

## 2019-03-25 DIAGNOSIS — H409 Unspecified glaucoma: Secondary | ICD-10-CM | POA: Diagnosis not present

## 2019-03-25 MED ORDER — AMLODIPINE BESYLATE 10 MG PO TABS: 10.0000 mg | ORAL_TABLET | Freq: Every day | ORAL | 3 refills | Status: DC

## 2019-03-25 MED ORDER — PRAVASTATIN SODIUM 40 MG PO TABS: 40.0000 mg | ORAL_TABLET | Freq: Every day | ORAL | 3 refills | Status: DC

## 2019-03-25 MED ORDER — VALSARTAN 160 MG PO TABS: ORAL_TABLET | ORAL | 3 refills | Status: DC

## 2019-03-25 NOTE — Patient Instructions (Signed)
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

## 2019-04-02 ENCOUNTER — Other Ambulatory Visit (INDEPENDENT_AMBULATORY_CARE_PROVIDER_SITE_OTHER): Payer: Medicare Other

## 2019-04-02 ENCOUNTER — Other Ambulatory Visit: Payer: Self-pay | Admitting: Family Medicine

## 2019-04-02 ENCOUNTER — Other Ambulatory Visit: Payer: Self-pay

## 2019-04-02 ENCOUNTER — Encounter: Payer: Self-pay | Admitting: Family Medicine

## 2019-04-02 DIAGNOSIS — I10 Essential (primary) hypertension: Secondary | ICD-10-CM

## 2019-04-02 DIAGNOSIS — E785 Hyperlipidemia, unspecified: Secondary | ICD-10-CM | POA: Diagnosis not present

## 2019-04-02 DIAGNOSIS — R17 Unspecified jaundice: Secondary | ICD-10-CM

## 2019-04-02 LAB — CBC
HCT: 45.7 % (ref 39.0–52.0)
Hemoglobin: 15.7 g/dL (ref 13.0–17.0)
MCHC: 34.5 g/dL (ref 30.0–36.0)
MCV: 88.2 fl (ref 78.0–100.0)
Platelets: 178 10*3/uL (ref 150.0–400.0)
RBC: 5.18 Mil/uL (ref 4.22–5.81)
RDW: 13.6 % (ref 11.5–15.5)
WBC: 5.4 10*3/uL (ref 4.0–10.5)

## 2019-04-02 LAB — COMPREHENSIVE METABOLIC PANEL
ALT: 39 U/L (ref 0–53)
AST: 34 U/L (ref 0–37)
Albumin: 4.2 g/dL (ref 3.5–5.2)
Alkaline Phosphatase: 70 U/L (ref 39–117)
BUN: 14 mg/dL (ref 6–23)
CO2: 25 mEq/L (ref 19–32)
Calcium: 9.3 mg/dL (ref 8.4–10.5)
Chloride: 107 mEq/L (ref 96–112)
Creatinine, Ser: 1.14 mg/dL (ref 0.40–1.50)
GFR: 76.59 mL/min (ref >60.00)
Glucose, Bld: 79 mg/dL (ref 70–99)
Potassium: 4 mEq/L (ref 3.5–5.1)
Sodium: 139 mEq/L (ref 135–145)
Total Bilirubin: 1.6 mg/dL — ABNORMAL HIGH (ref 0.2–1.2)
Total Protein: 7.2 g/dL (ref 6.0–8.3)

## 2019-04-02 LAB — LIPID PANEL
Cholesterol: 163 mg/dL (ref 0–200)
HDL: 36.3 mg/dL — ABNORMAL LOW (ref >39.00)
LDL Cholesterol: 90 mg/dL (ref 0–99)
NonHDL: 126.24
Total CHOL/HDL Ratio: 4
Triglycerides: 181 mg/dL — ABNORMAL HIGH (ref 0.0–149.0)
VLDL: 36.2 mg/dL (ref 0.0–40.0)

## 2019-04-02 NOTE — Progress Notes (Signed)
Results for orders placed or performed in visit on 04/02/19  Lipid panel  Result Value Ref Range   Cholesterol 163 0 - 200 mg/dL   Triglycerides 181.0 (H) 0.0 - 149.0 mg/dL   HDL 36.30 (L) >39.00 mg/dL   VLDL 36.2 0.0 - 40.0 mg/dL   LDL Cholesterol 90 0 - 99 mg/dL   Total CHOL/HDL Ratio 4    NonHDL 126.24   Comprehensive metabolic panel  Result Value Ref Range   Sodium 139 135 - 145 mEq/L   Potassium 4.0 3.5 - 5.1 mEq/L   Chloride 107 96 - 112 mEq/L   CO2 25 19 - 32 mEq/L   Glucose, Bld 79 70 - 99 mg/dL   BUN 14 6 - 23 mg/dL   Creatinine, Ser 1.14 0.40 - 1.50 mg/dL   Total Bilirubin 1.6 (H) 0.2 - 1.2 mg/dL   Alkaline Phosphatase 70 39 - 117 U/L   AST 34 0 - 37 U/L   ALT 39 0 - 53 U/L   Total Protein 7.2 6.0 - 8.3 g/dL   Albumin 4.2 3.5 - 5.2 g/dL   Calcium 9.3 8.4 - 10.5 mg/dL   GFR 76.59 >60.00 mL/min  CBC  Result Value Ref Range   WBC 5.4 4.0 - 10.5 K/uL   RBC 5.18 4.22 - 5.81 Mil/uL   Platelets 178.0 150.0 - 400.0 K/uL   Hemoglobin 15.7 13.0 - 17.0 g/dL   HCT 45.7 39.0 - 52.0 %   MCV 88.2 78.0 - 100.0 fl   MCHC 34.5 30.0 - 36.0 g/dL   RDW 13.6 11.5 - 15.5 %

## 2019-05-08 DIAGNOSIS — H524 Presbyopia: Secondary | ICD-10-CM | POA: Diagnosis not present

## 2019-05-08 DIAGNOSIS — H401122 Primary open-angle glaucoma, left eye, moderate stage: Secondary | ICD-10-CM | POA: Diagnosis not present

## 2019-05-30 ENCOUNTER — Other Ambulatory Visit: Payer: Self-pay

## 2019-05-31 ENCOUNTER — Encounter: Payer: Self-pay | Admitting: Medical

## 2019-05-31 ENCOUNTER — Ambulatory Visit (INDEPENDENT_AMBULATORY_CARE_PROVIDER_SITE_OTHER): Payer: Medicare Other | Admitting: Medical

## 2019-05-31 VITALS — BP 152/62 | HR 58 | Temp 98.1°F | Resp 16 | Ht 66.0 in | Wt 162.0 lb

## 2019-05-31 DIAGNOSIS — W57XXXA Bitten or stung by nonvenomous insect and other nonvenomous arthropods, initial encounter: Secondary | ICD-10-CM

## 2019-05-31 DIAGNOSIS — S30861A Insect bite (nonvenomous) of abdominal wall, initial encounter: Secondary | ICD-10-CM | POA: Diagnosis not present

## 2019-05-31 NOTE — Patient Instructions (Addendum)
You did have obvious tick post procedure. Easily identifiable after removed and inspected.  Counseled on diff dx pre- procedure.  Signed consent form.   Sterile prepped and draped. Used local anesthetic spray. Removed tick.(before procedure considered large skin tag vs tick)  Will get tick bite studies. If any signs or symtpoms occur as discussed will put on antibiotic doxy. Otherwise will determine if needs antibiotic based on antibody results.  Follow up as needed

## 2019-05-31 NOTE — Progress Notes (Signed)
Subjective:    Patient ID: Justin Walter, male    DOB: Apr 07, 1946, 73 y.o.   MRN: [id number]  HPI  Pt states he has area in his groin region. He noticed possible growth in area. He thinks on surface of skin. Just noticed yesterday morning.  Pt state about 2 weeks ago area. He states lable of tag on towel might have somehow cut his skin.   Review of Systems  Constitutional: Negative for chills, fatigue and fever.  Respiratory: Negative for cough, chest tightness, shortness of breath and wheezing.   Cardiovascular: Negative for chest pain and palpitations.  Gastrointestinal: Negative for abdominal pain.  Genitourinary: Negative for difficulty urinating and enuresis.  Musculoskeletal: Negative for back pain and neck pain.  Skin: Negative for rash.       Rt groin are. Appear to have tick vs skin tag. Size of tick.  Neurological: Negative for dizziness, speech difficulty, weakness and headaches.  Hematological: Negative for adenopathy. Does not bruise/bleed easily.  Psychiatric/Behavioral: Negative for behavioral problems, confusion, dysphoric mood and suicidal ideas. The patient is not nervous/anxious.      Past Medical History:  Diagnosis Date  . Blood transfusion without reported diagnosis   . Glaucoma   . Hyperlipidemia   . Hypertension   . Ulcer      Social History   Socioeconomic History  . Marital status: Divorced    Spouse name: Not on file  . Number of children: Not on file  . Years of education: Not on file  . Highest education level: Not on file  Occupational History  . Occupation: Retired    Fish farm manager: RETIRED  Social Needs  . Financial resource strain: Not on file  . Food insecurity    Worry: Not on file    Inability: Not on file  . Transportation needs    Medical: Not on file    Non-medical: Not on file  Tobacco Use  . Smoking status: Never Smoker  . Smokeless tobacco: Never Used  Substance and Sexual Activity  . Alcohol use: Yes    Alcohol/week:  2.0 standard drinks    Types: 2 Standard drinks or equivalent per week    Comment: occass  . Drug use: No  . Sexual activity: Yes  Lifestyle  . Physical activity    Days per week: Not on file    Minutes per session: Not on file  . Stress: Not on file  Relationships  . Social Herbalist on phone: Not on file    Gets together: Not on file    Attends religious service: Not on file    Active member of club or organization: Not on file    Attends meetings of clubs or organizations: Not on file    Relationship status: Not on file  . Intimate partner violence    Fear of current or ex partner: Not on file    Emotionally abused: Not on file    Physically abused: Not on file    Forced sexual activity: Not on file  Other Topics Concern  . Not on file  Social History Narrative   Divorced. Education: Grade School. Exercise: Yes.    Past Surgical History:  Procedure Laterality Date  . bleeding ulcer     1971  . CYST EXCISION     forehead    Family History  Problem Relation Age of Onset  . Cancer Mother        pancreatic cancer  . Stroke Father   .  Cancer Sister     Allergies  Allergen Reactions  . Lotrel [Amlodipine Besy-Benazepril Hcl] Swelling    Current Outpatient Medications on File Prior to Visit  Medication Sig Dispense Refill  . amLODipine (NORVASC) 10 MG tablet Take 1 tablet (10 mg total) by mouth daily. 90 tablet 3  . bimatoprost (LUMIGAN) 0.03 % ophthalmic solution 1 drop at bedtime.    Marland Kitchen desonide (DESOWEN) 0.05 % cream Apply topically 2 (two) times daily. 15 g 1  . dorzolamide (TRUSOPT) 2 % ophthalmic solution 1 drop 3 (three) times daily.    . pravastatin (PRAVACHOL) 40 MG tablet Take 1 tablet (40 mg total) by mouth daily. 90 tablet 3  . valsartan (DIOVAN) 160 MG tablet TAKE 1 TABLET BY MOUTH EVERY DAY. 90 tablet 3  . zoster vaccine live, PF, (ZOSTAVAX) 75883 UNT/0.65ML injection Inject 19,400 Units into the skin once. 1 each 0   No current  facility-administered medications on file prior to visit.     BP (!) 152/62   Pulse (!) 58   Temp 98.1 F (36.7 C) (Oral)   Resp 16   Ht 5\' 6"  (1.676 m)   Wt 162 lb (73.5 kg)   SpO2 100%   BMI 26.15 kg/m           Objective:   Physical Exam  General- No acute distress. Pleasant patient. Neck- Full range of motion, no jvd Lungs- Clear, even and unlabored. Heart- regular rate and rhythm. Neurologic- CNII- XII grossly intact.  Rt groin area- 80mm lesion vs tick. Color looks like a tick. But I don't  See appendages.(post removal on inspection it became obvious that it was a tick. Initially could not see les/appendages as they were curled under the body of tick. Small minimal indurated area at site of tick bite. But no redness. No swelling. No dc.        Assessment & Plan:  You did have obvious tick post procedure. Easily identifiable after removed and inspected.  Counseled on diff dx pre- procedure.  Signed consent form.   Sterile prepped and draped. Used local anesthetic spray. Removed tick.  Will get tick bite studies. If any signs or symtpoms occur as discussed will put on antibiotic doxy. Otherwise will determine if needs antibiotic based on antibody results.  Follow up as needed  40 minutes spent with pt. 50% of time spent counseling pt pre procedure and post procedure. Explaining potential treating now with antibiotic vs watching for signs/symptoms or positive studies. As well as reviewing side effect of antibiotics. Pt chose not to start antibiotic today. Answered pt questions regarding tick bite.  Later pt had question on his bp when I was already in room with other pt. Advised to check his bp daily over next week and update on readings.  Mackie Pai, PA-C

## 2019-06-03 ENCOUNTER — Ambulatory Visit: Payer: Medicare Other | Admitting: Family Medicine

## 2019-06-03 LAB — ROCKY MTN SPOTTED FVR ABS PNL(IGG+IGM)
RMSF IgG: NOT DETECTED
RMSF IgM: NOT DETECTED

## 2019-06-03 LAB — B. BURGDORFI ANTIBODIES: B burgdorferi Ab IgG+IgM: <0.9 index

## 2019-06-03 NOTE — Progress Notes (Signed)
[place] [facility] [place], Justin Walter, Justin Walter, Justin   MRN:  [id number]  PCP:  Darreld Mclean, Justin    Chief Complaint: Hypertension (bp check)   History of Present Illness:  Linville Decarolis is a 73 y.o. very pleasant male patient who presents with the following:  Virtual visit today to discuss elevated blood pressure Patient location is home, provider location is office Patient identity confirmed with 2 factors, he gives consent for virtual visit today Patient with history of hypertension, hyperlipidemia, glaucoma He was in recently to see Percell Miller for tick removal-blood pressure noted to be slightly high at that time  BP Readings from Last 3 Encounters:  07/22/Walter 138/80  07/17/Walter (!) 152/62  03/21/18 128/82   He takes amlodipine 10 mg Valsartan 160 Pravachol  He did check his BP at home over this past weekend- he was running about 120s/70s No CP or SOB  He feels generally very good The tick bite site seems to have healed, no concerns about this either.  He was a bit confused about whether he had a skin tag or a tick, explained the Percell Miller did feel that what he removed was a tick. No rashes,   He enjoys walking and yoga for exercise  He is quite active and healthy  Patient Active Problem List   Diagnosis Date Noted  . Diverticulosis 08/16/2017  . Elevated PSA 08/07/2017  . Glaucoma 09/03/2013  . HYPERLIPIDEMIA 01/20/2010  . HYPERTENSION 01/20/2010  . 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    Past Medical History:  Diagnosis Date  . Blood transfusion without reported diagnosis   . Glaucoma   . Hyperlipidemia   . Hypertension   . Ulcer     Past Surgical History:  Procedure Laterality Date  . bleeding ulcer     1971  . CYST EXCISION     forehead    Social History   Tobacco Use  . Smoking status: Never Smoker  . Smokeless tobacco:  Never Used  Substance Use Topics  . Alcohol use: Yes    Alcohol/week: 2.0 standard drinks    Types: 2 Standard drinks or equivalent per week    Comment: occass  . Drug use: No    Family History  Problem Relation Age of Onset  . Cancer Mother        pancreatic cancer  . Stroke Father   . Cancer Sister     Allergies  Allergen Reactions  . Lotrel [Amlodipine Besy-Benazepril Hcl] Swelling    Medication list has been reviewed and updated.  Current Outpatient Medications on File Prior to Visit  Medication Sig Dispense Refill  . amLODipine (NORVASC) 10 MG tablet Take 1 tablet (10 mg total) by mouth daily. 90 tablet 3  . bimatoprost (LUMIGAN) 0.03 % ophthalmic solution 1 drop at bedtime.    Marland Kitchen desonide (DESOWEN) 0.05 % cream Apply topically 2 (two) times daily. 15 g 1  . dorzolamide (TRUSOPT) 2 % ophthalmic solution 1 drop 3 (three) times daily.    . pravastatin (PRAVACHOL) 40 MG tablet Take 1 tablet (40 mg total) by mouth daily. 90 tablet 3  . valsartan (DIOVAN) 160 MG tablet TAKE 1 TABLET BY MOUTH EVERY DAY. 90 tablet 3   No current facility-administered medications on file prior to visit.     Review of Systems:  As per HPI- otherwise negative.  Physical Examination: Vitals:   07/22/Walter 0921  BP: 138/80  Pulse: 71  Resp: 16  Temp: 98.1 F (36.7 C)  SpO2: 98%   Vitals:   07/22/Walter 0921  Weight: 161 lb (73 kg)  Height: 5\' 6"  (1.676 m)   Body mass index is 25.99 kg/m. Ideal Body Weight: Weight in (lb) to have BMI = 25: 154.6  GEN: WDWN, NAD, Non-toxic, A & O x 3, looks well, normal weight.  Very fit for age 44: Atraumatic, Normocephalic. Neck supple. No masses, No LAD. Ears and Nose: No external deformity. CV: RRR, No M/G/R. No JVD. No thrill. No extra heart sounds. PULM: CTA B, no wheezes, crackles, rhonchi. No retractions. No resp. distress. No accessory muscle use. ABD: S, NT, ND, +BS. No rebound. No HSM. EXTR: No c/c/e NEURO Normal gait.  PSYCH:  Normally interactive. Conversant. Not depressed or anxious appearing.  Calm demeanor.  BP Readings from Last 3 Encounters:  07/22/Walter 138/80  07/17/Walter (!) 152/62  03/21/18 128/82     Assessment and Plan:   ICD-10-CM   1. Essential hypertension  I10    Following up on blood pressure today. His blood pressure seems to be under good control.  Checked in both arms today, 130/80 bilaterally.  Advised him to continue his current medications, he will monitor his blood pressure at home a few times a month He had labs drawn in May, these are reassuring Answered questions about recent tick bite today  Follow-up: No follow-ups on file.  No orders of the defined types were placed in this encounter.  No orders of the defined types were placed in this encounter.       Signed Lamar Blinks, Justin

## 2019-06-05 ENCOUNTER — Other Ambulatory Visit: Payer: Self-pay

## 2019-06-05 ENCOUNTER — Ambulatory Visit (INDEPENDENT_AMBULATORY_CARE_PROVIDER_SITE_OTHER): Payer: Medicare Other | Admitting: Family Medicine

## 2019-06-05 ENCOUNTER — Encounter: Payer: Self-pay | Admitting: Family Medicine

## 2019-06-05 VITALS — BP 130/80 | HR 71 | Temp 98.1°F | Resp 16 | Ht 66.0 in | Wt 161.0 lb

## 2019-06-05 DIAGNOSIS — W57XXXD Bitten or stung by nonvenomous insect and other nonvenomous arthropods, subsequent encounter: Secondary | ICD-10-CM

## 2019-06-05 DIAGNOSIS — I10 Essential (primary) hypertension: Secondary | ICD-10-CM | POA: Diagnosis not present

## 2019-06-05 NOTE — Patient Instructions (Signed)
Great to see you again today- I think your BP is ok, continue current meds and check a few times a month Goal 120- 135/70- 85 Let me know if you are running significantly outside of these parameters I did want to mention the shingles vaccine- Shingrix- which you can have done at your drug store at your convenience

## 2019-06-23 NOTE — Progress Notes (Addendum)
[place] [facility], [place], [place] [address] [phone]  Date:  06/26/2019   Name:  Justin Walter   DOB:  01/07/1946   MRN:  [id number]  PCP:  Justin Mclean, MD    Chief Complaint: Annual Exam   History of Present Illness:  Justin Walter is a 73 y.o. very pleasant male patient who presents with the following:  3 month follow-up visit today  History of hyperlipidemia, HTN, glaucoma Last seen in July to check on his BP - at that time his BP was under good control with valsartan 150 and amlodipine 10 mg   He has checked his BP readings at home off and on- looks ok  His eyes are stable- recent eye appt with Dr Justin Walter   Exercises with walking and yoga  He is doing home yoga and is happy doing this  He notes that his mood is good, he is not feeling overly stressed by the  Amlodipine lumigan tuusopt pravachol-we discussed his cholesterol, could be improved.  He does not wish to change to crestor at this time, will add omega 3  Valsartan   The 10-year ASCVD risk score [name], et al., 2013) is: 19.2%   Values used to calculate the score:     Age: 74 years     Sex: Male     Is Non-Hispanic African American: Yes     Diabetic: No     Tobacco smoker: No     Systolic Blood Pressure: 229 mmHg     Is BP treated: Yes     HDL Cholesterol: 36.3 mg/dL     Total Cholesterol: 163 mg/dL  Patient Active Problem List   Diagnosis Date Noted  . Diverticulosis 08/16/2017  . Elevated PSA 08/07/2017  . Glaucoma 09/03/2013  . HYPERLIPIDEMIA 01/20/2010  . HYPERTENSION 01/20/2010  . 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    Past Medical History:  Diagnosis Date  . Blood transfusion without reported diagnosis   . Glaucoma   . Hyperlipidemia   . Hypertension   . Ulcer     Past Surgical History:  Procedure Laterality Date  . bleeding ulcer     1971  . CYST EXCISION     forehead    Social History   Tobacco Use   . Smoking status: Never Smoker  . Smokeless tobacco: Never Used  Substance Use Topics  . Alcohol use: Yes    Alcohol/week: 2.0 standard drinks    Types: 2 Standard drinks or equivalent per week    Comment: occass  . Drug use: No    Family History  Problem Relation Age of Onset  . Cancer Mother        pancreatic cancer  . Stroke Father   . Cancer Sister     Allergies  Allergen Reactions  . Lotrel [Amlodipine Besy-Benazepril Hcl] Swelling    Medication list has been reviewed and updated.  Current Outpatient Medications on File Prior to Visit  Medication Sig Dispense Refill  . amLODipine (NORVASC) 10 MG tablet Take 1 tablet (10 mg total) by mouth daily. 90 tablet 3  . bimatoprost (LUMIGAN) 0.03 % ophthalmic solution 1 drop at bedtime.    Marland Kitchen desonide (DESOWEN) 0.05 % cream Apply topically 2 (two) times daily. 15 g 1  . dorzolamide (TRUSOPT) 2 % ophthalmic solution 1 drop 3 (three) times daily.    . pravastatin (PRAVACHOL) 40 MG tablet Take 1 tablet (40 mg  total) by mouth daily. 90 tablet 3  . valsartan (DIOVAN) 160 MG tablet TAKE 1 TABLET BY MOUTH EVERY DAY. 90 tablet 3   No current facility-administered medications on file prior to visit.     Review of Systems:  As per HPI- otherwise negative. No fever or chills No CP or SOB  Physical Examination: Vitals:   06/26/19 0840  BP: 124/68  Pulse: 66  Resp: 16  Temp: 97.9 F (36.6 C)  SpO2: 99%   Vitals:   06/26/19 0840  Weight: 160 lb (72.6 kg)  Height: 5\' 6"  (1.676 m)   Body mass index is 25.82 kg/m. Ideal Body Weight: Weight in (lb) to have BMI = 25: 154.6  GEN: WDWN, NAD, Non-toxic, A & O x 3, normal weight, looks well  HEENT: Atraumatic, Normocephalic. Neck supple. No masses, No LAD. Ears and Nose: No external deformity. CV: RRR, No M/G/R. No JVD. No thrill. No extra heart sounds. PULM: CTA B, no wheezes, crackles, rhonchi. No retractions. No resp. distress. No accessory muscle use. ABD: S, NT, ND, +BS.  No rebound. No HSM.  Ventral hernia  EXTR: No c/c/e NEURO Normal gait.  PSYCH: Normally interactive. Conversant. Not depressed or anxious appearing.  Calm demeanor.  Right groin: There is a small area of hypertrophy and hyperpigmentation, likely due to recent tick bite.  However also appears there may be some sebaceous material accumulation under the skin   Assessment and Plan:   ICD-10-CM   1. Essential hypertension  I10   2. Elevated bilirubin  R17 Bilirubin, fractionated(tot/dir/indir)  3. Glaucoma, unspecified glaucoma type, unspecified laterality  H40.9   4. Hyperlipidemia, unspecified hyperlipidemia type  E78.5   5. Tick bite, subsequent encounter  W57.XXXD    Following up on a few concerns today BP is well controlled  Recent elevated bili- repeat today Continue pravachol Educated about ticks today- he really has not experienced them.  Suggested a warm compress to the area on his groin as I suspect there is some trapped sebaceous material  Will plan further follow- up pending labs. Plan to visit in 6 months   Follow-up: No follow-ups on file.  No orders of the defined types were placed in this encounter.  Orders Placed This Encounter  Procedures  . Bilirubin, fractionated(tot/dir/indir)      Signed Lamar Blinks, MD  Received his labs, letter to pt  Results for orders placed or performed in visit on 06/26/19  Bilirubin, fractionated(tot/dir/indir)  Result Value Ref Range   Total Bilirubin 1.0 0.2 - 1.2 mg/dL   Bilirubin, Direct 0.2 0.0 - 0.2 mg/dL   Indirect Bilirubin 0.8 0.2 - 1.2 mg/dL (calc)

## 2019-06-26 ENCOUNTER — Ambulatory Visit (INDEPENDENT_AMBULATORY_CARE_PROVIDER_SITE_OTHER): Payer: Medicare Other | Admitting: Family Medicine

## 2019-06-26 ENCOUNTER — Other Ambulatory Visit: Payer: Self-pay

## 2019-06-26 ENCOUNTER — Encounter: Payer: Self-pay | Admitting: Family Medicine

## 2019-06-26 VITALS — BP 124/68 | HR 66 | Temp 97.9°F | Resp 16 | Ht 66.0 in | Wt 160.0 lb

## 2019-06-26 DIAGNOSIS — I10 Essential (primary) hypertension: Secondary | ICD-10-CM

## 2019-06-26 DIAGNOSIS — R17 Unspecified jaundice: Secondary | ICD-10-CM | POA: Diagnosis not present

## 2019-06-26 DIAGNOSIS — W57XXXD Bitten or stung by nonvenomous insect and other nonvenomous arthropods, subsequent encounter: Secondary | ICD-10-CM | POA: Diagnosis not present

## 2019-06-26 DIAGNOSIS — E785 Hyperlipidemia, unspecified: Secondary | ICD-10-CM | POA: Diagnosis not present

## 2019-06-26 DIAGNOSIS — H409 Unspecified glaucoma: Secondary | ICD-10-CM

## 2019-06-26 LAB — BILIRUBIN, FRACTIONATED(TOT/DIR/INDIR)
Bilirubin, Direct: 0.2 mg/dL (ref 0.0–0.2)
Indirect Bilirubin: 0.8 mg/dL (calc) (ref 0.2–1.2)
Total Bilirubin: 1 mg/dL (ref 0.2–1.2)

## 2019-06-26 NOTE — Patient Instructions (Signed)
Great to see you again today Take care, continue to exercise We are rechecking your bilirubin today, this was elevated back in May.  I suspect it will be normal this time  Otherwise your labs are up to date Consider getting the shingles vaccine- shingrix- at your drug store at your convenience

## 2019-09-05 DIAGNOSIS — H401111 Primary open-angle glaucoma, right eye, mild stage: Secondary | ICD-10-CM | POA: Diagnosis not present

## 2019-10-01 ENCOUNTER — Telehealth: Payer: Self-pay | Admitting: Family Medicine

## 2019-10-01 NOTE — Telephone Encounter (Signed)
Patient declined AWV at this time. SF °

## 2019-10-09 ENCOUNTER — Other Ambulatory Visit: Payer: Self-pay

## 2019-10-18 NOTE — Telephone Encounter (Signed)
Patient is calling to schedule AWV. Please advise CB- [phone]

## 2019-10-22 NOTE — Telephone Encounter (Signed)
Scheduled

## 2019-10-29 NOTE — Progress Notes (Addendum)
[place] [facility] [address], [place], [place] [address] [phone]  Date:  10/30/2019   Name:  Justin Walter   DOB:  07-Jun-1946   MRN:  [id number]  PCP:  Darreld Mclean, MD    Chief Complaint: Annual Exam   History of Present Illness:  Justin Walter is a 73 y.o. very pleasant male patient who presents with the following:  Here today for an in office annual wellness check, Medicare Patient with history of hyperlipidemia, hypertension, glaucoma, elevated PSA, ulcer in the 70s Never smoker  He has noted some post nasal drip for a week or so He is not having a ST, but has a feeling of mucus in his throat  He notes history of strep throat, but this does not feel like strep  Last by myself in August He enjoys walking and doing yoga I had him add omega-3 to his pravastatin at our last visit, he declined switch to Crestor  Flu vaccine- will do for him today  Suggest shingles at pharmacy-he will think about this Blood work today He sees Dr. Junious Silk for prostate follow-up.    His family is doing well He has not been walking as much due to cold weather- he does yoga at home  He wants to get back into walking more for exercise  He does have a home BP cuff but does not typically check his blood pressure at home-he can start doing so  BP Readings from Last 3 Encounters:  10/30/19 140/80  06/26/19 124/68  06/05/19 130/80   Wt Readings from Last 3 Encounters:  10/30/19 162 lb (73.5 kg)  06/26/19 160 lb (72.6 kg)  06/05/19 161 lb (73 kg)     Patient Active Problem List   Diagnosis Date Noted  . Diverticulosis 08/16/2017  . Elevated PSA 08/07/2017  . Glaucoma 09/03/2013  . HYPERLIPIDEMIA 01/20/2010  . HYPERTENSION 01/20/2010  . 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    Past Medical History:  Diagnosis Date  . Blood transfusion without reported diagnosis   . Glaucoma   . Hyperlipidemia   . Hypertension   . Ulcer      Past Surgical History:  Procedure Laterality Date  . bleeding ulcer     1971  . CYST EXCISION     forehead    Social History   Tobacco Use  . Smoking status: Never Smoker  . Smokeless tobacco: Never Used  Substance Use Topics  . Alcohol use: Yes    Alcohol/week: 2.0 standard drinks    Types: 2 Standard drinks or equivalent per week    Comment: occass  . Drug use: No    Family History  Problem Relation Age of Onset  . Cancer Mother        pancreatic cancer  . Stroke Father   . Cancer Sister     Allergies  Allergen Reactions  . Lotrel [Amlodipine Besy-Benazepril Hcl] Swelling    Medication list has been reviewed and updated.  Current Outpatient Medications on File Prior to Visit  Medication Sig Dispense Refill  . amLODipine (NORVASC) 10 MG tablet Take 1 tablet (10 mg total) by mouth daily. 90 tablet 3  . bimatoprost (LUMIGAN) 0.03 % ophthalmic solution 1 drop at bedtime.    Marland Kitchen desonide (DESOWEN) 0.05 % cream Apply topically 2 (two) times daily. 15 g 1  . dorzolamide (TRUSOPT) 2 % ophthalmic solution 1 drop 3 (three) times daily.    . pravastatin (PRAVACHOL)  40 MG tablet Take 1 tablet (40 mg total) by mouth daily. 90 tablet 3  . valsartan (DIOVAN) 160 MG tablet TAKE 1 TABLET BY MOUTH EVERY DAY. 90 tablet 3   No current facility-administered medications on file prior to visit.    Review of Systems:  As per HPI- otherwise negative. No fever or chills, no chest pain or shortness of breath  Physical Examination: Vitals:   10/30/19 0840 10/30/19 0854  BP: (!) 146/80 140/80  Pulse: 71   Resp: 16   Temp: (!) 96.5 F (35.8 C)   SpO2: 97%    Vitals:   10/30/19 0840  Weight: 162 lb (73.5 kg)  Height: 5\' 6"  (1.676 m)   Body mass index is 26.15 kg/m. Ideal Body Weight: Weight in (lb) to have BMI = 25: 154.6  GEN: WDWN, NAD, Non-toxic, A & O x 3, normal weight, looks well  HEENT: Atraumatic, Normocephalic. Neck supple. No masses, No LAD.  Bilateral TM  wnl, oropharynx normal.  PEERL,EOMI. nasal cavity is somewhat inflamed consistent with likely allergies Ears and Nose: No external deformity. CV: RRR, No M/G/R. No JVD. No thrill. No extra heart sounds. PULM: CTA B, no wheezes, crackles, rhonchi. No retractions. No resp. distress. No accessory muscle use. EXTR: No c/c/e NEURO Normal gait.  PSYCH: Normally interactive. Conversant. Not depressed or anxious appearing.  Calm demeanor.    Assessment and Plan: Essential hypertension - Plan: CBC, Comprehensive metabolic panel  Hyperlipidemia, unspecified hyperlipidemia type - Plan: Lipid panel  Medication monitoring encounter  Needs flu shot - Plan: Flu Vaccine QUAD High Dose(Fluad)  Here today with a couple of concerns Blood pressure a bit higher than normal, continue current medications.  He will start to monitor this at home and let me know if running high Routine labs pending Flu shot given Recommended Shingrix at pharmacy Discussed Covid testing, patient wants to know what to do if he wishes to be tested in the future.  I gave him information about scheduling testing through [facility] health For nasal symptoms, suggested an over-the-counter nonsedating antihistamine This visit occurred during the SARS-CoV-2 public health emergency.  Safety protocols were in place, including screening questions prior to the visit, additional usage of staff PPE, and extensive cleaning of exam room while observing appropriate contact time as indicated for disinfecting solutions.   Signed Lamar Blinks, MD  Received his labs, letter to pt Suggest change to crestor   Results for orders placed or performed in visit on 10/30/19  CBC  Result Value Ref Range   WBC 6.4 4.0 - 10.5 K/uL   RBC 5.44 4.22 - 5.81 Mil/uL   Platelets 181.0 150.0 - 400.0 K/uL   Hemoglobin 16.1 13.0 - 17.0 g/dL   HCT 48.7 39.0 - 52.0 %   MCV 89.6 78.0 - 100.0 fl   MCHC 33.0 30.0 - 36.0 g/dL   RDW 13.1 11.5 - 15.5 %  Comprehensive  metabolic panel  Result Value Ref Range   Sodium 139 135 - 145 mEq/L   Potassium 3.9 3.5 - 5.1 mEq/L   Chloride 106 96 - 112 mEq/L   CO2 27 19 - 32 mEq/L   Glucose, Bld 86 70 - 99 mg/dL   BUN 12 6 - 23 mg/dL   Creatinine, Ser 1.05 0.40 - 1.50 mg/dL   Total Bilirubin 1.2 0.2 - 1.2 mg/dL   Alkaline Phosphatase 76 39 - 117 U/L   AST 22 0 - 37 U/L   ALT 25 0 - 53 U/L  Total Protein 7.7 6.0 - 8.3 g/dL   Albumin 4.5 3.5 - 5.2 g/dL   GFR 84.07 >60.00 mL/min   Calcium 9.8 8.4 - 10.5 mg/dL  Lipid panel  Result Value Ref Range   Cholesterol 178 0 - 200 mg/dL   Triglycerides 249.0 (H) 0.0 - 149.0 mg/dL   HDL 39.90 >39.00 mg/dL   VLDL 49.8 (H) 0.0 - 40.0 mg/dL   Total CHOL/HDL Ratio 4    NonHDL 137.83   LDL cholesterol, direct  Result Value Ref Range   Direct LDL 89.0 mg/dL

## 2019-10-29 NOTE — Patient Instructions (Addendum)
It was great to see you again today, I will be in touch with your labs ASAP  Please consider getting the shingles vaccine, called Shingrix, at your pharmacy at your convenience  Flu shot today [place] keep an eye on your BP at home- if you are routinely running higher than 140/90 please let me know For your sinus symptoms, I would suggest an OTC allergy medication such as Claritin or Zyrtec  To be tested for Covid 19, please contact the [facility] testing services to schedule a testing appt  [email or web address]   Health Maintenance After Age 38 After age 80, you are at a higher risk for certain long-term diseases and infections as well as injuries from falls. Falls are a major cause of broken bones and head injuries in people who are older than age 82. Getting regular preventive care can help to keep you healthy and well. Preventive care includes getting regular testing and making lifestyle changes as recommended by your health care provider. Talk with your health care provider about:  Which screenings and tests you should have. A screening is a test that checks for a disease when you have no symptoms.  A diet and exercise plan that is right for you. What should I know about screenings and tests to prevent falls? Screening and testing are the best ways to find a health problem early. Early diagnosis and treatment give you the best chance of managing medical conditions that are common after age 34. Certain conditions and lifestyle choices may make you more likely to have a fall. Your health care provider may recommend:  Regular vision checks. Poor vision and conditions such as cataracts can make you more likely to have a fall. If you wear glasses, make sure to get your prescription updated if your vision changes.  Medicine review. Work with your health care provider to regularly review all of the medicines you are taking, including over-the-counter medicines. Ask  your health care provider about any side effects that may make you more likely to have a fall. Tell your health care provider if any medicines that you take make you feel dizzy or sleepy.  Osteoporosis screening. Osteoporosis is a condition that causes the bones to get weaker. This can make the bones weak and cause them to break more easily.  Blood pressure screening. Blood pressure changes and medicines to control blood pressure can make you feel dizzy.  Strength and balance checks. Your health care provider may recommend certain tests to check your strength and balance while standing, walking, or changing positions.  Foot health exam. Foot pain and numbness, as well as not wearing proper footwear, can make you more likely to have a fall.  Depression screening. You may be more likely to have a fall if you have a fear of falling, feel emotionally low, or feel unable to do activities that you used to do.  Alcohol use screening. Using too much alcohol can affect your balance and may make you more likely to have a fall. What actions can I take to lower my risk of falls? General instructions  Talk with your health care provider about your risks for falling. Tell your health care provider if: ? You fall. Be sure to tell your health care provider about all falls, even ones that seem minor. ? You feel dizzy, sleepy, or off-balance.  Take over-the-counter and prescription medicines only as told by your health care provider. These include any supplements.  Eat a healthy diet and maintain a healthy  weight. A healthy diet includes low-fat dairy products, low-fat (lean) meats, and fiber from whole grains, beans, and lots of fruits and vegetables. Home safety  Remove any tripping hazards, such as rugs, cords, and clutter.  Install safety equipment such as grab bars in bathrooms and safety rails on stairs.  Keep rooms and walkways well-lit. Activity   Follow a regular exercise program to stay fit.  This will help you maintain your balance. Ask your health care provider what types of exercise are appropriate for you.  If you need a cane or walker, use it as recommended by your health care provider.  Wear supportive shoes that have nonskid soles. Lifestyle  Do not drink alcohol if your health care provider tells you not to drink.  If you drink alcohol, limit how much you have: ? 0-1 drink a day for women. ? 0-2 drinks a day for men.  Be aware of how much alcohol is in your drink. In the [place] one drink equals one typical bottle of beer (12 oz), one-half glass of wine (5 oz), or one shot of hard liquor (1 oz).  Do not use any products that contain nicotine or tobacco, such as cigarettes and e-cigarettes. If you need help quitting, ask your health care provider. Summary  Having a healthy lifestyle and getting preventive care can help to protect your health and wellness after age 49.  Screening and testing are the best way to find a health problem early and help you avoid having a fall. Early diagnosis and treatment give you the best chance for managing medical conditions that are more common for people who are older than age 69.  Falls are a major cause of broken bones and head injuries in people who are older than age 53. Take precautions to prevent a fall at home.  Work with your health care provider to learn what changes you can make to improve your health and wellness and to prevent falls. This information is not intended to replace advice given to you by your health care provider. Make sure you discuss any questions you have with your health care provider. Document Released: 09/13/2017 Document Revised: 02/21/2019 Document Reviewed: 09/13/2017 Elsevier Patient Education  2020 [employer].

## 2019-10-30 ENCOUNTER — Other Ambulatory Visit: Payer: Self-pay

## 2019-10-30 ENCOUNTER — Ambulatory Visit (INDEPENDENT_AMBULATORY_CARE_PROVIDER_SITE_OTHER): Payer: Medicare Other | Admitting: Family Medicine

## 2019-10-30 ENCOUNTER — Encounter: Payer: Self-pay | Admitting: Family Medicine

## 2019-10-30 VITALS — BP 140/80 | HR 71 | Temp 96.5°F | Resp 16 | Ht 66.0 in | Wt 162.0 lb

## 2019-10-30 DIAGNOSIS — Z23 Encounter for immunization: Secondary | ICD-10-CM

## 2019-10-30 DIAGNOSIS — Z5181 Encounter for therapeutic drug level monitoring: Secondary | ICD-10-CM | POA: Diagnosis not present

## 2019-10-30 DIAGNOSIS — E785 Hyperlipidemia, unspecified: Secondary | ICD-10-CM | POA: Diagnosis not present

## 2019-10-30 DIAGNOSIS — I10 Essential (primary) hypertension: Secondary | ICD-10-CM | POA: Diagnosis not present

## 2019-10-30 LAB — COMPREHENSIVE METABOLIC PANEL
ALT: 25 U/L (ref 0–53)
AST: 22 U/L (ref 0–37)
Albumin: 4.5 g/dL (ref 3.5–5.2)
Alkaline Phosphatase: 76 U/L (ref 39–117)
BUN: 12 mg/dL (ref 6–23)
CO2: 27 mEq/L (ref 19–32)
Calcium: 9.8 mg/dL (ref 8.4–10.5)
Chloride: 106 mEq/L (ref 96–112)
Creatinine, Ser: 1.05 mg/dL (ref 0.40–1.50)
GFR: 84.07 mL/min (ref >60.00)
Glucose, Bld: 86 mg/dL (ref 70–99)
Potassium: 3.9 mEq/L (ref 3.5–5.1)
Sodium: 139 mEq/L (ref 135–145)
Total Bilirubin: 1.2 mg/dL (ref 0.2–1.2)
Total Protein: 7.7 g/dL (ref 6.0–8.3)

## 2019-10-30 LAB — CBC
HCT: 48.7 % (ref 39.0–52.0)
Hemoglobin: 16.1 g/dL (ref 13.0–17.0)
MCHC: 33 g/dL (ref 30.0–36.0)
MCV: 89.6 fl (ref 78.0–100.0)
Platelets: 181 10*3/uL (ref 150.0–400.0)
RBC: 5.44 Mil/uL (ref 4.22–5.81)
RDW: 13.1 % (ref 11.5–15.5)
WBC: 6.4 10*3/uL (ref 4.0–10.5)

## 2019-10-30 LAB — LIPID PANEL
Cholesterol: 178 mg/dL (ref 0–200)
HDL: 39.9 mg/dL (ref >39.00)
NonHDL: 137.83
Total CHOL/HDL Ratio: 4
Triglycerides: 249 mg/dL — ABNORMAL HIGH (ref 0.0–149.0)
VLDL: 49.8 mg/dL — ABNORMAL HIGH (ref 0.0–40.0)

## 2019-10-30 LAB — LDL CHOLESTEROL, DIRECT: Direct LDL: 89 mg/dL

## 2019-12-24 ENCOUNTER — Other Ambulatory Visit: Payer: Self-pay

## 2019-12-24 ENCOUNTER — Ambulatory Visit: Payer: Medicare Other | Attending: Internal Medicine

## 2019-12-24 DIAGNOSIS — Z23 Encounter for immunization: Secondary | ICD-10-CM

## 2019-12-24 NOTE — Progress Notes (Signed)
   Covid-19 Vaccination Clinic  Name:  Justin Walter    MRN: [id number] DOB: 03-03-1946  12/24/2019  [name] was observed post Covid-19 immunization for 15 minutes without incidence. He was provided with Vaccine Information Sheet and instruction to access the V-Safe system.   [name] was instructed to call 911 with any severe reactions post vaccine: Marland Kitchen Difficulty breathing  . Swelling of your face and throat  . A fast heartbeat  . A bad rash all over your body  . Dizziness and weakness    Immunizations Administered    Name Date Dose VIS Date Route   Pfizer COVID-19 Vaccine 12/24/2019  2:38 PM 0.3 mL 10/25/2019 Intramuscular   Manufacturer: [place]   Lot: [id number]   [place]: [id number]

## 2020-01-09 DIAGNOSIS — H401111 Primary open-angle glaucoma, right eye, mild stage: Secondary | ICD-10-CM | POA: Diagnosis not present

## 2020-01-15 ENCOUNTER — Other Ambulatory Visit: Payer: Self-pay

## 2020-01-15 ENCOUNTER — Ambulatory Visit (INDEPENDENT_AMBULATORY_CARE_PROVIDER_SITE_OTHER): Payer: Medicare Other | Admitting: Family Medicine

## 2020-01-15 ENCOUNTER — Encounter: Payer: Self-pay | Admitting: Family Medicine

## 2020-01-15 DIAGNOSIS — E785 Hyperlipidemia, unspecified: Secondary | ICD-10-CM | POA: Diagnosis not present

## 2020-01-15 MED ORDER — ROSUVASTATIN CALCIUM 20 MG PO TABS: 20.0000 mg | ORAL_TABLET | Freq: Every day | ORAL | 3 refills | Status: DC

## 2020-01-15 NOTE — Progress Notes (Signed)
[facility] at [facility] [address] [place], [place] [address] [id number] [phone]  Date:  01/15/2020   Name:  Justin Walter   DOB:  12/10/1945   MRN:  [id number]  PCP:  Darreld Mclean, MD    Chief Complaint: No chief complaint on file.   History of Present Illness:  Justin Walter is a 74 y.o. very pleasant male patient who presents with the following:  Virtual visit today to follow-up on medications Patient location is home, provider is at office.  Patient identity confirmed with 2 factors, he gives consent for virtual visit today The patient and myself are present on the call today  Last seen by myself in December for annual wellness exam, at that time he was a bit concerned about his blood pressure we did labs at that time, and I suggested changing his statin to Crestor  He contacted me today to discuss his most recent labs; he is actually just gotten around to opening his lab letter He is taking pravastatin 40 mg currently  We discussed potential risks and benefits of changing over to Crestor -He is willing to give this a try, would like to wait until he is used up some of the 90-day pravastatin Rx he has on hand The 10-year ASCVD risk score [name], et al., 2013) is: 23.5%   Values used to calculate the score:     Age: 42 years     Sex: Male     Is Non-Hispanic African American: Yes     Diabetic: No     Tobacco smoker: No     Systolic Blood Pressure: [id number] mmHg     Is BP treated: Yes     HDL Cholesterol: 39.9 mg/dL     Total Cholesterol: 178 mg/dL    BP Readings from Last 3 Encounters:  10/30/19 140/80  06/26/19 124/68  06/05/19 130/80     Patient Active Problem List   Diagnosis Date Noted  . Diverticulosis 08/16/2017  . Elevated PSA 08/07/2017  . Glaucoma 09/03/2013  . HYPERLIPIDEMIA 01/20/2010  . HYPERTENSION 01/20/2010  . 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    Past Medical History:  Diagnosis Date  . Blood  transfusion without reported diagnosis   . Glaucoma   . Hyperlipidemia   . Hypertension   . Ulcer     Past Surgical History:  Procedure Laterality Date  . bleeding ulcer     1971  . CYST EXCISION     forehead    Social History   Tobacco Use  . Smoking status: Never Smoker  . Smokeless tobacco: Never Used  Substance Use Topics  . Alcohol use: Yes    Alcohol/week: 2.0 standard drinks    Types: 2 Standard drinks or equivalent per week    Comment: occass  . Drug use: No    Family History  Problem Relation Age of Onset  . Cancer Mother        pancreatic cancer  . Stroke Father   . Cancer Sister     Allergies  Allergen Reactions  . Lotrel [Amlodipine Besy-Benazepril Hcl] Swelling    Medication list has been reviewed and updated.  Current Outpatient Medications on File Prior to Visit  Medication Sig Dispense Refill  . amLODipine (NORVASC) 10 MG tablet Take 1 tablet (10 mg total) by mouth daily. 90 tablet 3  . bimatoprost (LUMIGAN) 0.03 % ophthalmic solution 1 drop at bedtime.    Marland Kitchen desonide (DESOWEN)  0.05 % cream Apply topically 2 (two) times daily. 15 g 1  . dorzolamide (TRUSOPT) 2 % ophthalmic solution 1 drop 3 (three) times daily.    . pravastatin (PRAVACHOL) 40 MG tablet Take 1 tablet (40 mg total) by mouth daily. 90 tablet 3  . valsartan (DIOVAN) 160 MG tablet TAKE 1 TABLET BY MOUTH EVERY DAY. 90 tablet 3   No current facility-administered medications on file prior to visit.    Review of Systems:  As per HPI- otherwise negative.   Physical Examination: There were no vitals filed for this visit. There were no vitals filed for this visit. There is no height or weight on file to calculate BMI. Ideal Body Weight:    Pt observed over video monitor He looks well, his normal self  Assessment and Plan: Hyperlipidemia, unspecified hyperlipidemia type  We scheduled him for a visit in June for follow-up He got his first covid shot and 2nd dose is scheduled   Discussed switching over to Crestor, he is willing to do this in a month or 2.  I sent in a prescription for him, he will let me know how it is tolerated.  If he does well, I can update prescription for 90 days  Signed Lamar Blinks, MD

## 2020-01-18 ENCOUNTER — Ambulatory Visit: Payer: Medicare Other | Attending: Internal Medicine

## 2020-01-18 DIAGNOSIS — Z23 Encounter for immunization: Secondary | ICD-10-CM | POA: Insufficient documentation

## 2020-01-18 NOTE — Progress Notes (Signed)
   Covid-19 Vaccination Clinic  Name:  Justin Walter    MRN: [id number] DOB: [birth year]  01/18/2020  [name] was observed post Covid-19 immunization for 15 minutes without incident. He was provided with Vaccine Information Sheet and instruction to access the V-Safe system.   [name] was instructed to call 911 with any severe reactions post vaccine: Marland Kitchen Difficulty breathing  . Swelling of face and throat  . A fast heartbeat  . A bad rash all over body  . Dizziness and weakness   Immunizations Administered    Name Date Dose VIS Date Route   Pfizer COVID-19 Vaccine 01/18/2020  8:36 AM 0.3 mL 10/25/2019 Intramuscular   Manufacturer: [place]   Lot: [id number]   [place]: [phone]

## 2020-02-06 DIAGNOSIS — H401123 Primary open-angle glaucoma, left eye, severe stage: Secondary | ICD-10-CM | POA: Diagnosis not present

## 2020-02-06 DIAGNOSIS — H401112 Primary open-angle glaucoma, right eye, moderate stage: Secondary | ICD-10-CM | POA: Diagnosis not present

## 2020-02-06 DIAGNOSIS — H25013 Cortical age-related cataract, bilateral: Secondary | ICD-10-CM | POA: Diagnosis not present

## 2020-02-06 DIAGNOSIS — H2513 Age-related nuclear cataract, bilateral: Secondary | ICD-10-CM | POA: Diagnosis not present

## 2020-02-06 DIAGNOSIS — H401131 Primary open-angle glaucoma, bilateral, mild stage: Secondary | ICD-10-CM | POA: Diagnosis not present

## 2020-02-26 DIAGNOSIS — N400 Enlarged prostate without lower urinary tract symptoms: Secondary | ICD-10-CM | POA: Diagnosis not present

## 2020-02-26 DIAGNOSIS — R972 Elevated prostate specific antigen [PSA]: Secondary | ICD-10-CM | POA: Diagnosis not present

## 2020-02-28 DIAGNOSIS — R972 Elevated prostate specific antigen [PSA]: Secondary | ICD-10-CM | POA: Diagnosis not present

## 2020-03-27 DIAGNOSIS — H401123 Primary open-angle glaucoma, left eye, severe stage: Secondary | ICD-10-CM | POA: Diagnosis not present

## 2020-03-27 DIAGNOSIS — H401112 Primary open-angle glaucoma, right eye, moderate stage: Secondary | ICD-10-CM | POA: Diagnosis not present

## 2020-03-27 DIAGNOSIS — H25013 Cortical age-related cataract, bilateral: Secondary | ICD-10-CM | POA: Diagnosis not present

## 2020-03-27 DIAGNOSIS — H2513 Age-related nuclear cataract, bilateral: Secondary | ICD-10-CM | POA: Diagnosis not present

## 2020-03-28 ENCOUNTER — Other Ambulatory Visit: Payer: Self-pay | Admitting: Family Medicine

## 2020-03-28 DIAGNOSIS — I10 Essential (primary) hypertension: Secondary | ICD-10-CM

## 2020-03-28 DIAGNOSIS — E785 Hyperlipidemia, unspecified: Secondary | ICD-10-CM

## 2020-04-19 NOTE — Patient Instructions (Addendum)
It was great to see you again today,we will schedule you for labs in 2-3 months Please see me in about 6 months   Please consider having the shingles vaccine given at your pharmacy-Shingrix  Your BP is a bit low today- may be due to not eating much from recent tooth surgery.  Please monitor your BP at home- if you continue to run lower than 130/85 or so we can reduce your amlodipine to 5 mg

## 2020-04-19 NOTE — Progress Notes (Signed)
[facility] at [facility] [place], [place], [place] [address] [phone]  Date:  04/22/2020   Name:  Justin Walter   DOB:  [birth year]   MRN:  [id number]  PCP:  Darreld Mclean, MD    Chief Complaint: Hypertension (lab work) and Hyperlipidemia   History of Present Illness:  Justin Walter is a 74 y.o. very pleasant male patient who presents with the following:  Patient with history of hypertension, hyperlipidemia, glaucoma Here today for a recheck visit and lab work-  Last seen by myself for a virtual visit in March of this year  Colon cancer screening up-to-date Covid series complete Can suggest Shingrix- discussed with him.  He plans to do this at the drug store Most recent labs in December 2020 Recent urology visit; seen in April at [facility] recently changed him over from [place].  He changed to crestor not long ago- he is tolerating this ok so far. He started it about 3 weeks ago- will  Valsartan Eyedrops per ophthalmology, [facility]  He is exercising on a regular basis He did have oral surgery last week- had a tooth pulled.  He has recovered well but is still on soft foods  BP Readings from Last 3 Encounters:  04/22/20 110/72  10/30/19 140/80  06/26/19 124/68   Wt Readings from Last 3 Encounters:  04/22/20 160 lb (72.6 kg)  10/30/19 162 lb (73.5 kg)  06/26/19 160 lb (72.6 kg)    Patient Active Problem List   Diagnosis Date Noted  . Diverticulosis 08/16/2017  . Elevated PSA 08/07/2017  . Glaucoma 09/03/2013  . HYPERLIPIDEMIA 01/20/2010  . HYPERTENSION 01/20/2010  . 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    Past Medical History:  Diagnosis Date  . Blood transfusion without reported diagnosis   . Glaucoma   . Hyperlipidemia   . Hypertension   . Ulcer     Past Surgical History:  Procedure Laterality Date  . bleeding ulcer     1971  . CYST EXCISION     forehead    Social History    Tobacco Use  . Smoking status: Never Smoker  . Smokeless tobacco: Never Used  Substance Use Topics  . Alcohol use: Yes    Alcohol/week: 2.0 standard drinks    Types: 2 Standard drinks or equivalent per week    Comment: occass  . Drug use: No    Family History  Problem Relation Age of Onset  . Cancer Mother        pancreatic cancer  . Stroke Father   . Cancer Sister     Allergies  Allergen Reactions  . Lotrel [Amlodipine Besy-Benazepril Hcl] Swelling    Medication list has been reviewed and updated.  Current Outpatient Medications on File Prior to Visit  Medication Sig Dispense Refill  . amLODipine (NORVASC) 10 MG tablet TAKE 1 TABLET BY MOUTH EVERY DAY 90 tablet 3  . bimatoprost (LUMIGAN) 0.03 % ophthalmic solution 1 drop at bedtime.    Marland Kitchen desonide (DESOWEN) 0.05 % cream Apply topically 2 (two) times daily. 15 g 1  . dorzolamide (TRUSOPT) 2 % ophthalmic solution 1 drop 3 (three) times daily.    . pravastatin (PRAVACHOL) 40 MG tablet TAKE 1 TABLET BY MOUTH EVERY DAY 90 tablet 3  . rosuvastatin (CRESTOR) 20 MG tablet Take 1 tablet (20 mg total) by mouth daily. 30 tablet 3  . valsartan (DIOVAN) 160 MG tablet TAKE 1 TABLET BY  MOUTH EVERY DAY 90 tablet 3   No current facility-administered medications on file prior to visit.    Review of Systems:  As per HPI- otherwise negative.   Physical Examination: Vitals:   04/22/20 0825  BP: 110/72  Pulse: 61  Temp: 98.3 F (36.8 C)  SpO2: 99%   Vitals:   04/22/20 0825  Weight: 160 lb (72.6 kg)  Height: 5\' 6"  (1.676 m)   Body mass index is 25.82 kg/m. Ideal Body Weight: Weight in (lb) to have BMI = 25: 154.6  GEN: no acute distress. Normal weight, looks well  HEENT: Atraumatic, Normocephalic.  Ears and Nose: No external deformity. CV: RRR, No M/G/R. No JVD. No thrill. No extra heart sounds. PULM: CTA B, no wheezes, crackles, rhonchi. No retractions. No resp. distress. No accessory muscle use. ABD: S, NT, ND, +BS.  No rebound. No HSM. EXTR: No c/c/e PSYCH: Normally interactive. Conversant.    Assessment and Plan: Hyperlipidemia, unspecified hyperlipidemia type - Plan: Lipid panel  Essential hypertension - Plan: Basic metabolic panel, CBC  Glaucoma, unspecified glaucoma type, unspecified laterality  Blood pressures under good control today-actually on the low side.  However, he recently had a tooth removed and has not been able to eat much the last several days.  He will continue to monitor his blood pressure at home, if running low can reduce amlodipine Patient changed from Pravachol to Crestor about 3 weeks ago, I placed future lab orders for him.  He will come back for labs in about 2 to 3 months so we can look for any improvement I requested urology records from his most recent visit This visit occurred during the SARS-CoV-2 public health emergency.  Safety protocols were in place, including screening questions prior to the visit, additional usage of staff PPE, and extensive cleaning of exam room while observing appropriate contact time as indicated for disinfecting solutions.    Signed Lamar Blinks, MD

## 2020-04-22 ENCOUNTER — Encounter: Payer: Self-pay | Admitting: Family Medicine

## 2020-04-22 ENCOUNTER — Ambulatory Visit (INDEPENDENT_AMBULATORY_CARE_PROVIDER_SITE_OTHER): Payer: Medicare Other | Admitting: Family Medicine

## 2020-04-22 ENCOUNTER — Other Ambulatory Visit: Payer: Self-pay

## 2020-04-22 VITALS — BP 110/72 | HR 61 | Temp 98.3°F | Ht 66.0 in | Wt 160.0 lb

## 2020-04-22 DIAGNOSIS — H409 Unspecified glaucoma: Secondary | ICD-10-CM

## 2020-04-22 DIAGNOSIS — E785 Hyperlipidemia, unspecified: Secondary | ICD-10-CM

## 2020-04-22 DIAGNOSIS — I10 Essential (primary) hypertension: Secondary | ICD-10-CM

## 2020-05-05 ENCOUNTER — Other Ambulatory Visit: Payer: Self-pay | Admitting: Family Medicine

## 2020-05-05 DIAGNOSIS — E785 Hyperlipidemia, unspecified: Secondary | ICD-10-CM

## 2020-05-27 ENCOUNTER — Other Ambulatory Visit: Payer: Self-pay | Admitting: Family Medicine

## 2020-05-27 DIAGNOSIS — N402 Nodular prostate without lower urinary tract symptoms: Secondary | ICD-10-CM | POA: Diagnosis not present

## 2020-05-27 DIAGNOSIS — N400 Enlarged prostate without lower urinary tract symptoms: Secondary | ICD-10-CM | POA: Diagnosis not present

## 2020-05-27 DIAGNOSIS — R972 Elevated prostate specific antigen [PSA]: Secondary | ICD-10-CM | POA: Diagnosis not present

## 2020-05-27 DIAGNOSIS — R17 Unspecified jaundice: Secondary | ICD-10-CM

## 2020-06-24 ENCOUNTER — Other Ambulatory Visit (INDEPENDENT_AMBULATORY_CARE_PROVIDER_SITE_OTHER): Payer: Medicare Other

## 2020-06-24 ENCOUNTER — Other Ambulatory Visit: Payer: Self-pay

## 2020-06-24 DIAGNOSIS — E785 Hyperlipidemia, unspecified: Secondary | ICD-10-CM | POA: Diagnosis not present

## 2020-06-24 DIAGNOSIS — I10 Essential (primary) hypertension: Secondary | ICD-10-CM

## 2020-06-24 LAB — CBC
HCT: 42.9 % (ref 39.0–52.0)
Hemoglobin: 14.5 g/dL (ref 13.0–17.0)
MCHC: 33.9 g/dL (ref 30.0–36.0)
MCV: 88.4 fl (ref 78.0–100.0)
Platelets: 153 10*3/uL (ref 150.0–400.0)
RBC: 4.86 Mil/uL (ref 4.22–5.81)
RDW: 14 % (ref 11.5–15.5)
WBC: 8.1 10*3/uL (ref 4.0–10.5)

## 2020-06-24 LAB — BASIC METABOLIC PANEL
BUN: 14 mg/dL (ref 6–23)
CO2: 22 mEq/L (ref 19–32)
Calcium: 9.4 mg/dL (ref 8.4–10.5)
Chloride: 109 mEq/L (ref 96–112)
Creatinine, Ser: 1.31 mg/dL (ref 0.40–1.50)
GFR: 65.01 mL/min (ref >60.00)
Glucose, Bld: 99 mg/dL (ref 70–99)
Potassium: 4.2 mEq/L (ref 3.5–5.1)
Sodium: 141 mEq/L (ref 135–145)

## 2020-06-24 LAB — LIPID PANEL
Cholesterol: 117 mg/dL (ref 0–200)
HDL: 41.8 mg/dL (ref >39.00)
LDL Cholesterol: 60 mg/dL (ref 0–99)
NonHDL: 75.47
Total CHOL/HDL Ratio: 3
Triglycerides: 78 mg/dL (ref 0.0–149.0)
VLDL: 15.6 mg/dL (ref 0.0–40.0)

## 2020-06-24 NOTE — Progress Notes (Signed)
Received labs as below, message to patient  Results for orders placed or performed in visit on 06/24/20  CBC  Result Value Ref Range   WBC 8.1 4.0 - 10.5 K/uL   RBC 4.86 4.22 - 5.81 Mil/uL   Platelets 153.0 150 - 400 K/uL   Hemoglobin 14.5 13.0 - 17.0 g/dL   HCT 42.9 39 - 52 %   MCV 88.4 78.0 - 100.0 fl   MCHC 33.9 30.0 - 36.0 g/dL   RDW 14.0 11.5 - 15.5 %  Lipid panel  Result Value Ref Range   Cholesterol 117 0 - 200 mg/dL   Triglycerides 78.0 0 - 149 mg/dL   HDL 41.80 >39.00 mg/dL   VLDL 15.6 0.0 - 40.0 mg/dL   LDL Cholesterol 60 0 - 99 mg/dL   Total CHOL/HDL Ratio 3    NonHDL 97.53   Basic metabolic panel  Result Value Ref Range   Sodium 141 135 - 145 mEq/L   Potassium 4.2 3.5 - 5.1 mEq/L   Chloride 109 96 - 112 mEq/L   CO2 22 19 - 32 mEq/L   Glucose, Bld 99 70 - 99 mg/dL   BUN 14 6 - 23 mg/dL   Creatinine, Ser 1.31 0.40 - 1.50 mg/dL   GFR 65.01 >60.00 mL/min   Calcium 9.4 8.4 - 10.5 mg/dL

## 2020-07-04 NOTE — Progress Notes (Signed)
[facility] at [facility] [place], [place], [place] [address] [phone]  Date:  07/06/2020   Name:  Justin Walter   DOB:  1946-09-03   MRN:  [id number]  PCP:  Darreld Mclean, MD    Chief Complaint: Follow-up (medications )   History of Present Illness:  Justin Walter is a 74 y.o. very pleasant male patient who presents with the following:  Pt here today for a follow-up visit- history of hypertension, hyperlipidemia, glaucoma Last seen by myself in June He has been noted to have elevated bilirubin in the past, this was normal on recheck  We changed from pravachol to crestor and recent lipids showed improvement Lipid panel done 8/11 He is tolerating the Crestor fine, no symptoms He decreased amlodipine to 5 mg He is checking his BP at home but he does not think his cuff is right- his BP is running too low  COVID up-to-date Suggest Shingrix Never smoker  Enjoys yoga, walking and biking for exercise  Patient Active Problem List   Diagnosis Date Noted  . Diverticulosis 08/16/2017  . Elevated PSA 08/07/2017  . Glaucoma 09/03/2013  . HYPERLIPIDEMIA 01/20/2010  . HYPERTENSION 01/20/2010  . 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    Past Medical History:  Diagnosis Date  . Blood transfusion without reported diagnosis   . Glaucoma   . Hyperlipidemia   . Hypertension   . Ulcer     Past Surgical History:  Procedure Laterality Date  . bleeding ulcer     1971  . CYST EXCISION     forehead    Social History   Tobacco Use  . Smoking status: Never Smoker  . Smokeless tobacco: Never Used  Vaping Use  . Vaping Use: Never used  Substance Use Topics  . Alcohol use: Yes    Alcohol/week: 2.0 standard drinks    Types: 2 Standard drinks or equivalent per week    Comment: occass  . Drug use: No    Family History  Problem Relation Age of Onset  . Cancer Mother        pancreatic cancer  . Stroke Father   . Cancer Sister      Allergies  Allergen Reactions  . Lotrel [Amlodipine Besy-Benazepril Hcl] Swelling    Medication list has been reviewed and updated.  Current Outpatient Medications on File Prior to Visit  Medication Sig Dispense Refill  . amLODipine (NORVASC) 10 MG tablet TAKE 1 TABLET BY MOUTH EVERY DAY 90 tablet 3  . bimatoprost (LUMIGAN) 0.03 % ophthalmic solution 1 drop at bedtime.    Marland Kitchen desonide (DESOWEN) 0.05 % cream Apply topically 2 (two) times daily. 15 g 1  . dorzolamide (TRUSOPT) 2 % ophthalmic solution 1 drop 3 (three) times daily.    Marland Kitchen LUMIGAN 0.01 % SOLN SMARTSIG:2 Drop(s) In Eye(s) Every Night    . rosuvastatin (CRESTOR) 20 MG tablet Take 1 tablet (20 mg total) by mouth daily. 90 tablet 0  . valsartan (DIOVAN) 160 MG tablet TAKE 1 TABLET BY MOUTH EVERY DAY 90 tablet 3   No current facility-administered medications on file prior to visit.    Review of Systems:  As per HPI- otherwise negative.   Physical Examination: Vitals:   07/06/20 1358  BP: 136/78  Pulse: 60  Temp: 97.8 F (36.6 C)  SpO2: 98%   Vitals:   07/06/20 1358  Weight: 157 lb 12.8 oz (71.6 kg)  Height: 5\' 6"  (1.676 m)  Body mass index is 25.47 kg/m. Ideal Body Weight: Weight in (lb) to have BMI = 25: 154.6  GEN: no acute distress.normal weight, looks well  HEENT: Atraumatic, Normocephalic.  Ears and Nose: No external deformity. CV: RRR, No M/G/R. No JVD. No thrill. No extra heart sounds. PULM: CTA B, no wheezes, crackles, rhonchi. No retractions. No resp. distress. No accessory muscle use. ABD: S, NT, ND, +BS. No rebound. No HSM. EXTR: No c/c/e PSYCH: Normally interactive. Conversant.    Assessment and Plan: Hyperlipidemia, unspecified hyperlipidemia type - Plan: rosuvastatin (CRESTOR) 20 MG tablet  Essential hypertension - Plan: amLODipine (NORVASC) 5 MG tablet  Following up today Improvement of lipids with crestor, continue this BP running on the low side, he has already decreased  amlodipine to 5 mg.  Asked him too get a new cuff, he will monitor at home and we can DC entirely if need be  He reports no issues with swelling or any sign of allergic reaction with current Bp meds  Discussed immunizations Visit in about 6 months  This visit occurred during the SARS-CoV-2 public health emergency.  Safety protocols were in place, including screening questions prior to the visit, additional usage of staff PPE, and extensive cleaning of exam room while observing appropriate contact time as indicated for disinfecting solutions.    Signed Lamar Blinks, MD

## 2020-07-06 ENCOUNTER — Encounter: Payer: Self-pay | Admitting: Family Medicine

## 2020-07-06 ENCOUNTER — Ambulatory Visit (INDEPENDENT_AMBULATORY_CARE_PROVIDER_SITE_OTHER): Payer: Medicare Other | Admitting: Family Medicine

## 2020-07-06 ENCOUNTER — Other Ambulatory Visit: Payer: Self-pay

## 2020-07-06 VITALS — BP 115/70 | HR 60 | Temp 97.8°F | Ht 66.0 in | Wt 157.8 lb

## 2020-07-06 DIAGNOSIS — E785 Hyperlipidemia, unspecified: Secondary | ICD-10-CM | POA: Diagnosis not present

## 2020-07-06 DIAGNOSIS — H401112 Primary open-angle glaucoma, right eye, moderate stage: Secondary | ICD-10-CM | POA: Diagnosis not present

## 2020-07-06 DIAGNOSIS — H10012 Acute follicular conjunctivitis, left eye: Secondary | ICD-10-CM | POA: Diagnosis not present

## 2020-07-06 DIAGNOSIS — I10 Essential (primary) hypertension: Secondary | ICD-10-CM

## 2020-07-06 DIAGNOSIS — H401123 Primary open-angle glaucoma, left eye, severe stage: Secondary | ICD-10-CM | POA: Diagnosis not present

## 2020-07-06 MED ORDER — ROSUVASTATIN CALCIUM 20 MG PO TABS: 20.0000 mg | ORAL_TABLET | Freq: Every day | ORAL | 3 refills | Status: DC

## 2020-07-06 MED ORDER — AMLODIPINE BESYLATE 5 MG PO TABS: 5.0000 mg | ORAL_TABLET | Freq: Every day | ORAL | 3 refills | Status: DC

## 2020-07-06 NOTE — Patient Instructions (Addendum)
It was good to see you again today  Please consider getting the shingles vaccine, Shingrix, at your drugstore Work on getting your covid booster towards November  We will continue crestor - cholesterol looking good!  Keep up the good work with exercise Your BP is on the low side- continue amlodipine 5 mg  If your BP is running lower than 120/75 or so we can stop he amlodipine entirely   Please see me in 6 months

## 2020-08-05 ENCOUNTER — Other Ambulatory Visit: Payer: Self-pay | Admitting: Urology

## 2020-08-05 DIAGNOSIS — N402 Nodular prostate without lower urinary tract symptoms: Secondary | ICD-10-CM

## 2020-08-06 DIAGNOSIS — H401123 Primary open-angle glaucoma, left eye, severe stage: Secondary | ICD-10-CM | POA: Diagnosis not present

## 2020-08-12 DIAGNOSIS — R972 Elevated prostate specific antigen [PSA]: Secondary | ICD-10-CM | POA: Diagnosis not present

## 2020-08-19 DIAGNOSIS — N400 Enlarged prostate without lower urinary tract symptoms: Secondary | ICD-10-CM | POA: Diagnosis not present

## 2020-08-19 DIAGNOSIS — N402 Nodular prostate without lower urinary tract symptoms: Secondary | ICD-10-CM | POA: Diagnosis not present

## 2020-08-19 DIAGNOSIS — R972 Elevated prostate specific antigen [PSA]: Secondary | ICD-10-CM | POA: Diagnosis not present

## 2020-08-20 DIAGNOSIS — Z23 Encounter for immunization: Secondary | ICD-10-CM | POA: Diagnosis not present

## 2020-08-31 ENCOUNTER — Other Ambulatory Visit: Payer: Self-pay

## 2020-08-31 ENCOUNTER — Ambulatory Visit
Admission: RE | Admit: 2020-08-31 | Discharge: 2020-08-31 | Disposition: A | Discharge status: Home / Self Care | Payer: Medicare Other | Source: Ambulatory Visit | Attending: Urology | Admitting: Urology

## 2020-08-31 DIAGNOSIS — N400 Enlarged prostate without lower urinary tract symptoms: Secondary | ICD-10-CM | POA: Diagnosis not present

## 2020-08-31 DIAGNOSIS — N402 Nodular prostate without lower urinary tract symptoms: Secondary | ICD-10-CM

## 2020-08-31 MED ORDER — GADOBENATE DIMEGLUMINE 529 MG/ML IV SOLN
14.0000 mL | Freq: Once | INTRAVENOUS | Status: AC | PRN
  Administered 2020-08-31: 14 mL via INTRAVENOUS

## 2020-09-23 DIAGNOSIS — H401112 Primary open-angle glaucoma, right eye, moderate stage: Secondary | ICD-10-CM | POA: Diagnosis not present

## 2020-09-23 DIAGNOSIS — H2513 Age-related nuclear cataract, bilateral: Secondary | ICD-10-CM | POA: Diagnosis not present

## 2020-09-23 DIAGNOSIS — H401123 Primary open-angle glaucoma, left eye, severe stage: Secondary | ICD-10-CM | POA: Diagnosis not present

## 2020-10-18 NOTE — Patient Instructions (Signed)
Great to see you again today! Please see me in about 6 months and happy holidays!   Please consider getting the shingrix series at your pharmacy if not done already

## 2020-10-18 NOTE — Progress Notes (Deleted)
[place] at [facility] [address], [place], [place] [address] [phone]  Date:  10/21/2020   Name:  Justin Walter   DOB:  1946/11/04   MRN:  [id number]  PCP:  Darreld Mclean, MD    Chief Complaint: No chief complaint on file.   History of Present Illness:  Justin Walter is a 74 y.o. very pleasant male patient who presents with the following:  Here today for 6 month follow-up visit-history of HTN, hyperlipidemia and glaucoma Last seen by myself in August  Flu vaccine covid booster done shingrix  PSA per urology- he last saw [facility] in October of this year   Amlodipine  crestor Valsartan Eye drops   Patient Active Problem List   Diagnosis Date Noted  . Diverticulosis 08/16/2017  . Elevated PSA 08/07/2017  . Glaucoma 09/03/2013  . HYPERLIPIDEMIA 01/20/2010  . HYPERTENSION 01/20/2010  . 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    Past Medical History:  Diagnosis Date  . Blood transfusion without reported diagnosis   . Glaucoma   . Hyperlipidemia   . Hypertension   . Ulcer     Past Surgical History:  Procedure Laterality Date  . bleeding ulcer     1971  . CYST EXCISION     forehead    Social History   Tobacco Use  . Smoking status: Never Smoker  . Smokeless tobacco: Never Used  Vaping Use  . Vaping Use: Never used  Substance Use Topics  . Alcohol use: Yes    Alcohol/week: 2.0 standard drinks    Types: 2 Standard drinks or equivalent per week    Comment: occass  . Drug use: No    Family History  Problem Relation Age of Onset  . Cancer Mother        pancreatic cancer  . Stroke Father   . Cancer Sister     Allergies  Allergen Reactions  . Lotrel [Amlodipine Besy-Benazepril Hcl] Swelling    Medication list has been reviewed and updated.  Current Outpatient Medications on File Prior to Visit  Medication Sig Dispense Refill  . amLODipine (NORVASC) 5 MG tablet Take 1 tablet (5 mg total) by mouth  daily. 90 tablet 3  . bimatoprost (LUMIGAN) 0.03 % ophthalmic solution 1 drop at bedtime.    Marland Kitchen desonide (DESOWEN) 0.05 % cream Apply topically 2 (two) times daily. 15 g 1  . dorzolamide (TRUSOPT) 2 % ophthalmic solution 1 drop 3 (three) times daily.    Marland Kitchen LUMIGAN 0.01 % SOLN SMARTSIG:2 Drop(s) In Eye(s) Every Night    . rosuvastatin (CRESTOR) 20 MG tablet Take 1 tablet (20 mg total) by mouth daily. 90 tablet 3  . valsartan (DIOVAN) 160 MG tablet TAKE 1 TABLET BY MOUTH EVERY DAY 90 tablet 3   No current facility-administered medications on file prior to visit.    Review of Systems:  As per HPI- otherwise negative.   Physical Examination: There were no vitals filed for this visit. There were no vitals filed for this visit. There is no height or weight on file to calculate BMI. Ideal Body Weight:    GEN: no acute distress. HEENT: Atraumatic, Normocephalic.  Ears and Nose: No external deformity. CV: RRR, No M/G/R. No JVD. No thrill. No extra heart sounds. PULM: CTA B, no wheezes, crackles, rhonchi. No retractions. No resp. distress. No accessory muscle use. ABD: S, NT, ND, +BS. No rebound. No HSM. EXTR: No c/c/e PSYCH: Normally interactive. Conversant.  Assessment and Plan: *** This visit occurred during the SARS-CoV-2 public health emergency.  Safety protocols were in place, including screening questions prior to the visit, additional usage of staff PPE, and extensive cleaning of exam room while observing appropriate contact time as indicated for disinfecting solutions.    Signed Lamar Blinks, MD

## 2020-10-21 ENCOUNTER — Ambulatory Visit: Payer: Medicare Other | Admitting: Family Medicine

## 2020-10-21 DIAGNOSIS — Z0289 Encounter for other administrative examinations: Secondary | ICD-10-CM

## 2020-10-24 NOTE — Progress Notes (Addendum)
[place] at [facility] [address] [place], [place] [address] [phone]  Date:  10/26/2020   Name:  Justin Walter   DOB:  [birth year]   MRN:  [id number]  PCP:  Darreld Mclean, MD    Chief Complaint: Hyperlipidemia, Hypertension, and Flu Vaccine (Will do today/)   History of Present Illness:  Justin Walter is a 74 y.o. very pleasant male patient who presents with the following:  Patient here today for 62-month follow-up visit Last seen by myself in August- history of hypertension, hyperlipidemia, glaucoma At that time he was doing well in general, continues to be well.  He has recently obtained a new home blood pressure cuff and wanted to have it checked against our cuff for accuracy COVID-19 series complete including booster  Flu vaccine- give today  Can suggest shingles vaccine  He had a procedure done for his eyes over the summer- to decrease pressure  He does not tend to have eye pain, he is compliant with glaucoma treatment  He was seen by urology in October and had PSA follow-up He is walking for exercise- he enjoys riding an exercise bike as well His family is well   He had some tingling in his great toe in July-this seemed to occur after eating seafood, lasted for a day or 2 has since resolved.  He is not having any symptoms of claudication, he does not have diabetes   Patient Active Problem List   Diagnosis Date Noted  . Diverticulosis 08/16/2017  . Elevated PSA 08/07/2017  . Glaucoma 09/03/2013  . HYPERLIPIDEMIA 01/20/2010  . HYPERTENSION 01/20/2010  . 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    Past Medical History:  Diagnosis Date  . Blood transfusion without reported diagnosis   . Glaucoma   . Hyperlipidemia   . Hypertension   . Ulcer     Past Surgical History:  Procedure Laterality Date  . bleeding ulcer     1971  . CYST EXCISION     forehead    Social History   Tobacco Use  . Smoking status: Never  Smoker  . Smokeless tobacco: Never Used  Vaping Use  . Vaping Use: Never used  Substance Use Topics  . Alcohol use: Yes    Alcohol/week: 2.0 standard drinks    Types: 2 Standard drinks or equivalent per week    Comment: occass  . Drug use: No    Family History  Problem Relation Age of Onset  . Cancer Mother        pancreatic cancer  . Stroke Father   . Cancer Sister     Allergies  Allergen Reactions  . Brimonidine Other (See Comments)    Follicular conjunctivitis  . Lotrel [Amlodipine Besy-Benazepril Hcl] Swelling    Medication list has been reviewed and updated.  Current Outpatient Medications on File Prior to Visit  Medication Sig Dispense Refill  . amLODipine (NORVASC) 5 MG tablet Take 1 tablet (5 mg total) by mouth daily. 90 tablet 3  . bimatoprost (LUMIGAN) 0.03 % ophthalmic solution 1 drop at bedtime.    Marland Kitchen desonide (DESOWEN) 0.05 % cream Apply topically 2 (two) times daily. 15 g 1  . dorzolamide (TRUSOPT) 2 % ophthalmic solution 1 drop 3 (three) times daily.    Marland Kitchen LUMIGAN 0.01 % SOLN SMARTSIG:2 Drop(s) In Eye(s) Every Night    . rosuvastatin (CRESTOR) 20 MG tablet Take 1 tablet (20 mg total) by mouth daily. 90 tablet 3  .  valsartan (DIOVAN) 160 MG tablet TAKE 1 TABLET BY MOUTH EVERY DAY 90 tablet 3   No current facility-administered medications on file prior to visit.    Review of Systems:  As per HPI- otherwise negative.   Physical Examination: Vitals:   10/26/20 0937  BP: 122/76  Pulse: 94  Resp: 16  SpO2: 95%   Vitals:   10/26/20 0937  Weight: 169 lb (76.7 kg)  Height: 5\' 6"  (1.676 m)   Body mass index is 27.28 kg/m. Ideal Body Weight: Weight in (lb) to have BMI = 25: 154.6  GEN: no acute distress.  Mild overweight, looks well  HEENT: Atraumatic, Normocephalic.  Ears and Nose: No external deformity. CV: RRR, No M/G/R. No JVD. No thrill. No extra heart sounds. PULM: CTA B, no wheezes, crackles, rhonchi. No retractions. No resp. distress. No  accessory muscle use. ABD: S, NT, ND, +BS. No rebound. No HSM. EXTR: No c/c/e PSYCH: Normally interactive. Conversant.  Foot exam today reveals normal pulses and monofilament sensation  Assessment and Plan: Essential hypertension - Plan: Comprehensive metabolic panel, CBC  Needs flu shot - Plan: Flu Vaccine QUAD High Dose(Fluad)  Blood pressure cuff checked today.  On 2 readings his systolic pressure on home cuff is about 10 points higher than our cuff here.  I advised patient there may be some discrepancy  Given flu vaccine today  We will follow-up with complete metabolic and CBC today Patient is somewhat frustrated that his appointment was rescheduled last week when he arrived late.  I apologized for his frustration and explained reasoning behind our late policy This visit occurred during the SARS-CoV-2 public health emergency.  Safety protocols were in place, including screening questions prior to the visit, additional usage of staff PPE, and extensive cleaning of exam room while observing appropriate contact time as indicated for disinfecting solutions.    Signed Lamar Blinks, MD  Received his labs as below, message to pt Results for orders placed or performed in visit on 10/26/20  Comprehensive metabolic panel  Result Value Ref Range   Sodium 139 135 - 145 mEq/L   Potassium 3.8 3.5 - 5.1 mEq/L   Chloride 106 96 - 112 mEq/L   CO2 27 19 - 32 mEq/L   Glucose, Bld 73 70 - 99 mg/dL   BUN 11 6 - 23 mg/dL   Creatinine, Ser 1.25 0.40 - 1.50 mg/dL   Total Bilirubin 1.0 0.2 - 1.2 mg/dL   Alkaline Phosphatase 69 39 - 117 U/L   AST 27 0 - 37 U/L   ALT 29 0 - 53 U/L   Total Protein 7.7 6.0 - 8.3 g/dL   Albumin 4.4 3.5 - 5.2 g/dL   GFR 57.44 (L) >60.00 mL/min   Calcium 10.1 8.4 - 10.5 mg/dL  CBC  Result Value Ref Range   WBC 5.7 4.0 - 10.5 K/uL   RBC 5.45 4.22 - 5.81 Mil/uL   Platelets 185.0 150.0 - 400.0 K/uL   Hemoglobin 16.0 13.0 - 17.0 g/dL   HCT 48.3 39.0 - 52.0 %    MCV 88.5 78.0 - 100.0 fl   MCHC 33.1 30.0 - 36.0 g/dL   RDW 13.4 11.5 - 15.5 %

## 2020-10-24 NOTE — Patient Instructions (Addendum)
It was good to see you again today, take care and please see me in 6 months-  I will be in touch with your labs asap   Happy holidays!   Flu shot given today Please consider getting the shingles vaccine at your pharmacy if not done already

## 2020-10-26 ENCOUNTER — Encounter: Payer: Self-pay | Admitting: Family Medicine

## 2020-10-26 ENCOUNTER — Ambulatory Visit (INDEPENDENT_AMBULATORY_CARE_PROVIDER_SITE_OTHER): Payer: Medicare Other | Admitting: Family Medicine

## 2020-10-26 ENCOUNTER — Other Ambulatory Visit: Payer: Self-pay

## 2020-10-26 VITALS — BP 122/76 | HR 94 | Resp 16 | Ht 66.0 in | Wt 169.0 lb

## 2020-10-26 DIAGNOSIS — I10 Essential (primary) hypertension: Secondary | ICD-10-CM

## 2020-10-26 DIAGNOSIS — Z23 Encounter for immunization: Secondary | ICD-10-CM

## 2020-10-26 LAB — CBC
HCT: 48.3 % (ref 39.0–52.0)
Hemoglobin: 16 g/dL (ref 13.0–17.0)
MCHC: 33.1 g/dL (ref 30.0–36.0)
MCV: 88.5 fl (ref 78.0–100.0)
Platelets: 185 10*3/uL (ref 150.0–400.0)
RBC: 5.45 Mil/uL (ref 4.22–5.81)
RDW: 13.4 % (ref 11.5–15.5)
WBC: 5.7 10*3/uL (ref 4.0–10.5)

## 2020-10-26 LAB — COMPREHENSIVE METABOLIC PANEL
ALT: 29 U/L (ref 0–53)
AST: 27 U/L (ref 0–37)
Albumin: 4.4 g/dL (ref 3.5–5.2)
Alkaline Phosphatase: 69 U/L (ref 39–117)
BUN: 11 mg/dL (ref 6–23)
CO2: 27 mEq/L (ref 19–32)
Calcium: 10.1 mg/dL (ref 8.4–10.5)
Chloride: 106 mEq/L (ref 96–112)
Creatinine, Ser: 1.25 mg/dL (ref 0.40–1.50)
GFR: 57.44 mL/min — ABNORMAL LOW (ref >60.00)
Glucose, Bld: 73 mg/dL (ref 70–99)
Potassium: 3.8 mEq/L (ref 3.5–5.1)
Sodium: 139 mEq/L (ref 135–145)
Total Bilirubin: 1 mg/dL (ref 0.2–1.2)
Total Protein: 7.7 g/dL (ref 6.0–8.3)

## 2021-01-25 DIAGNOSIS — H401123 Primary open-angle glaucoma, left eye, severe stage: Secondary | ICD-10-CM | POA: Diagnosis not present

## 2021-01-25 DIAGNOSIS — H401112 Primary open-angle glaucoma, right eye, moderate stage: Secondary | ICD-10-CM | POA: Diagnosis not present

## 2021-02-23 DIAGNOSIS — Z23 Encounter for immunization: Secondary | ICD-10-CM | POA: Diagnosis not present

## 2021-03-17 DIAGNOSIS — H401112 Primary open-angle glaucoma, right eye, moderate stage: Secondary | ICD-10-CM | POA: Diagnosis not present

## 2021-03-17 DIAGNOSIS — H401123 Primary open-angle glaucoma, left eye, severe stage: Secondary | ICD-10-CM | POA: Diagnosis not present

## 2021-03-17 DIAGNOSIS — H25013 Cortical age-related cataract, bilateral: Secondary | ICD-10-CM | POA: Diagnosis not present

## 2021-03-17 DIAGNOSIS — H2513 Age-related nuclear cataract, bilateral: Secondary | ICD-10-CM | POA: Diagnosis not present

## 2021-03-22 ENCOUNTER — Other Ambulatory Visit: Payer: Self-pay | Admitting: Family Medicine

## 2021-03-22 DIAGNOSIS — I10 Essential (primary) hypertension: Secondary | ICD-10-CM

## 2021-03-23 ENCOUNTER — Other Ambulatory Visit: Payer: Self-pay

## 2021-03-23 DIAGNOSIS — I10 Essential (primary) hypertension: Secondary | ICD-10-CM

## 2021-03-23 MED ORDER — VALSARTAN 160 MG PO TABS: 160.0000 mg | ORAL_TABLET | Freq: Every day | ORAL | 0 refills | Status: DC

## 2021-03-30 DIAGNOSIS — R972 Elevated prostate specific antigen [PSA]: Secondary | ICD-10-CM | POA: Diagnosis not present

## 2021-04-04 NOTE — Patient Instructions (Addendum)
Good to see you again today- I will be in touch with your labs asap  Consider getting the shingrix vaccine for shingles at your pharmacy   Keep up the good work with diet and exercise    Health Maintenance After Age 73 After age 75, you are at a higher risk for certain long-term diseases and infections as well as injuries from falls. Falls are a major cause of broken bones and head injuries in people who are older than age 66. Getting regular preventive care can help to keep you healthy and well. Preventive care includes getting regular testing and making lifestyle changes as recommended by your health care provider. Talk with your health care provider about:  Which screenings and tests you should have. A screening is a test that checks for a disease when you have no symptoms.  A diet and exercise plan that is right for you. What should I know about screenings and tests to prevent falls? Screening and testing are the best ways to find a health problem early. Early diagnosis and treatment give you the best chance of managing medical conditions that are common after age 87. Certain conditions and lifestyle choices may make you more likely to have a fall. Your health care provider may recommend:  Regular vision checks. Poor vision and conditions such as cataracts can make you more likely to have a fall. If you wear glasses, make sure to get your prescription updated if your vision changes.  Medicine review. Work with your health care provider to regularly review all of the medicines you are taking, including over-the-counter medicines. Ask your health care provider about any side effects that may make you more likely to have a fall. Tell your health care provider if any medicines that you take make you feel dizzy or sleepy.  Osteoporosis screening. Osteoporosis is a condition that causes the bones to get weaker. This can make the bones weak and cause them to break more easily.  Blood pressure  screening. Blood pressure changes and medicines to control blood pressure can make you feel dizzy.  Strength and balance checks. Your health care provider may recommend certain tests to check your strength and balance while standing, walking, or changing positions.  Foot health exam. Foot pain and numbness, as well as not wearing proper footwear, can make you more likely to have a fall.  Depression screening. You may be more likely to have a fall if you have a fear of falling, feel emotionally low, or feel unable to do activities that you used to do.  Alcohol use screening. Using too much alcohol can affect your balance and may make you more likely to have a fall. What actions can I take to lower my risk of falls? General instructions  Talk with your health care provider about your risks for falling. Tell your health care provider if: ? You fall. Be sure to tell your health care provider about all falls, even ones that seem minor. ? You feel dizzy, sleepy, or off-balance.  Take over-the-counter and prescription medicines only as told by your health care provider. These include any supplements.  Eat a healthy diet and maintain a healthy weight. A healthy diet includes low-fat dairy products, low-fat (lean) meats, and fiber from whole grains, beans, and lots of fruits and vegetables. Home safety  Remove any tripping hazards, such as rugs, cords, and clutter.  Install safety equipment such as grab bars in bathrooms and safety rails on stairs.  Keep rooms and  walkways well-lit. Activity  Follow a regular exercise program to stay fit. This will help you maintain your balance. Ask your health care provider what types of exercise are appropriate for you.  If you need a cane or walker, use it as recommended by your health care provider.  Wear supportive shoes that have nonskid soles.   Lifestyle  Do not drink alcohol if your health care provider tells you not to drink.  If you drink  alcohol, limit how much you have: ? 0-1 drink a day for women. ? 0-2 drinks a day for men.  Be aware of how much alcohol is in your drink. In the [place] one drink equals one typical bottle of beer (12 oz), one-half glass of wine (5 oz), or one shot of hard liquor (1 oz).  Do not use any products that contain nicotine or tobacco, such as cigarettes and e-cigarettes. If you need help quitting, ask your health care provider. Summary  Having a healthy lifestyle and getting preventive care can help to protect your health and wellness after age 68.  Screening and testing are the best way to find a health problem early and help you avoid having a fall. Early diagnosis and treatment give you the best chance for managing medical conditions that are more common for people who are older than age 27.  Falls are a major cause of broken bones and head injuries in people who are older than age 48. Take precautions to prevent a fall at home.  Work with your health care provider to learn what changes you can make to improve your health and wellness and to prevent falls. This information is not intended to replace advice given to you by your health care provider. Make sure you discuss any questions you have with your health care provider. Document Revised: 02/21/2019 Document Reviewed: 09/13/2017 Elsevier Patient Education  2021 [employer].

## 2021-04-04 NOTE — Progress Notes (Addendum)
[place] at [facility] [address], [place], [place] [address] [phone]  Date:  04/07/2021   Name:  Justin Walter   DOB:  09/10/1946   MRN:  [id number]  PCP:  Darreld Mclean, MD    Chief Complaint: Hypertension (6 month follow up/) and Hyperlipidemia   History of Present Illness:  Justin Walter is a 75 y.o. very pleasant male patient who presents with the following:  Here today for a 6 month follow-up visit Last visit with myself in December - history of hypertension, hyperlipidemia, glaucoma He sees urology for PSA  Colon UTD covid 4th dose done shingrix   He did burn his right hand while cooking about a month ago- this seems to have healed up ok however  BP Readings from Last 3 Encounters:  04/07/21 122/82  10/26/20 122/76  07/06/20 115/70   He is walking for exercise- about 2.7 miles several times a week, and yoga  His glaucoma is stable- pressures are ok He does not get pain from his eyes   Never a smoker   He saw his urologist yesterday- they will send me report - [place]   Patient Active Problem List   Diagnosis Date Noted  . Diverticulosis 08/16/2017  . Elevated PSA 08/07/2017  . Glaucoma 09/03/2013  . HYPERLIPIDEMIA 01/20/2010  . HYPERTENSION 01/20/2010  . 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    Past Medical History:  Diagnosis Date  . Blood transfusion without reported diagnosis   . Glaucoma   . Hyperlipidemia   . Hypertension   . Ulcer     Past Surgical History:  Procedure Laterality Date  . bleeding ulcer     1971  . CYST EXCISION     forehead    Social History   Tobacco Use  . Smoking status: Never Smoker  . Smokeless tobacco: Never Used  Vaping Use  . Vaping Use: Never used  Substance Use Topics  . Alcohol use: Yes    Alcohol/week: 2.0 standard drinks    Types: 2 Standard drinks or equivalent per week    Comment: occass  . Drug use: No    Family History  Problem Relation Age  of Onset  . Cancer Mother        pancreatic cancer  . Stroke Father   . Cancer Sister     Allergies  Allergen Reactions  . Brimonidine Other (See Comments)    Follicular conjunctivitis  . Lotrel [Amlodipine Besy-Benazepril Hcl] Swelling    Medication list has been reviewed and updated.  Current Outpatient Medications on File Prior to Visit  Medication Sig Dispense Refill  . amLODipine (NORVASC) 5 MG tablet Take 1 tablet (5 mg total) by mouth daily. 90 tablet 3  . bimatoprost (LUMIGAN) 0.03 % ophthalmic solution 1 drop at bedtime.    Marland Kitchen desonide (DESOWEN) 0.05 % cream Apply topically 2 (two) times daily. 15 g 1  . dorzolamide (TRUSOPT) 2 % ophthalmic solution 1 drop 3 (three) times daily.    Marland Kitchen LUMIGAN 0.01 % SOLN SMARTSIG:2 Drop(s) In Eye(s) Every Night    . rosuvastatin (CRESTOR) 20 MG tablet Take 1 tablet (20 mg total) by mouth daily. 90 tablet 3  . valsartan (DIOVAN) 160 MG tablet Take 1 tablet (160 mg total) by mouth daily. 90 tablet 0   No current facility-administered medications on file prior to visit.    Review of Systems:  As per HPI- otherwise negative.   Physical Examination: Vitals:  04/07/21 0918  BP: 122/82  Pulse: (!) 55  Resp: 16  Temp: 97.8 F (36.6 C)  SpO2: 98%   Vitals:   04/07/21 0918  Weight: 163 lb 6.4 oz (74.1 kg)  Height: 5\' 6"  (1.676 m)   Body mass index is 26.37 kg/m. Ideal Body Weight: Weight in (lb) to have BMI = 25: 154.6  GEN: no acute distress.  Normal weight, looks well  HEENT: Atraumatic, Normocephalic.   Bilateral TM wnl, oropharynx normal.  PEERL,EOMI.   Ears and Nose: No external deformity. CV: RRR, No M/G/R. No JVD. No thrill. No extra heart sounds. PULM: CTA B, no wheezes, crackles, rhonchi. No retractions. No resp. distress. No accessory muscle use. ABD: S, NT, ND, +BS. No rebound. No HSM. EXTR: No c/c/e PSYCH: Normally interactive. Conversant.  Healed burn on right pinky finger Tetanus is UTD  Assessment and  Plan: Essential hypertension - Plan: CBC, Comprehensive metabolic panel  Hyperlipidemia, unspecified hyperlipidemia type - Plan: Lipid panel  Glaucoma, unspecified glaucoma type, unspecified laterality  Screening for diabetes mellitus  Following up today BP under good control Labs pending as above Continue good exercise habits Await urology note  Plan to visit in 6 months assuming all is well    This visit occurred during the SARS-CoV-2 public health emergency.  Safety protocols were in place, including screening questions prior to the visit, additional usage of staff PPE, and extensive cleaning of exam room while observing appropriate contact time as indicated for disinfecting solutions.    Signed Lamar Blinks, MD  Received his labs as below, message to pt  Results for orders placed or performed in visit on 04/07/21  CBC  Result Value Ref Range   WBC 5.2 4.0 - 10.5 K/uL   RBC 5.30 4.22 - 5.81 Mil/uL   Platelets 172.0 150.0 - 400.0 K/uL   Hemoglobin 15.3 13.0 - 17.0 g/dL   HCT 46.0 39.0 - 52.0 %   MCV 86.8 78.0 - 100.0 fl   MCHC 33.4 30.0 - 36.0 g/dL   RDW 13.9 11.5 - 15.5 %  Comprehensive metabolic panel  Result Value Ref Range   Sodium 140 135 - 145 mEq/L   Potassium 3.7 3.5 - 5.1 mEq/L   Chloride 109 96 - 112 mEq/L   CO2 22 19 - 32 mEq/L   Glucose, Bld 153 (H) 70 - 99 mg/dL   BUN 10 6 - 23 mg/dL   Creatinine, Ser 1.11 0.40 - 1.50 mg/dL   Total Bilirubin 1.1 0.2 - 1.2 mg/dL   Alkaline Phosphatase 70 39 - 117 U/L   AST 29 0 - 37 U/L   ALT 32 0 - 53 U/L   Total Protein 7.3 6.0 - 8.3 g/dL   Albumin 4.2 3.5 - 5.2 g/dL   GFR 66.03 >60.00 mL/min   Calcium 9.8 8.4 - 10.5 mg/dL  Lipid panel  Result Value Ref Range   Cholesterol 139 0 - 200 mg/dL   Triglycerides 164.0 (H) 0.0 - 149.0 mg/dL   HDL 39.10 >39.00 mg/dL   VLDL 32.8 0.0 - 40.0 mg/dL   LDL Cholesterol 67 0 - 99 mg/dL   Total CHOL/HDL Ratio 4    NonHDL 99.93

## 2021-04-06 DIAGNOSIS — N400 Enlarged prostate without lower urinary tract symptoms: Secondary | ICD-10-CM | POA: Diagnosis not present

## 2021-04-06 DIAGNOSIS — N402 Nodular prostate without lower urinary tract symptoms: Secondary | ICD-10-CM | POA: Diagnosis not present

## 2021-04-06 DIAGNOSIS — R972 Elevated prostate specific antigen [PSA]: Secondary | ICD-10-CM | POA: Diagnosis not present

## 2021-04-07 ENCOUNTER — Encounter: Payer: Self-pay | Admitting: Family Medicine

## 2021-04-07 ENCOUNTER — Ambulatory Visit (INDEPENDENT_AMBULATORY_CARE_PROVIDER_SITE_OTHER): Payer: Medicare Other | Admitting: Family Medicine

## 2021-04-07 ENCOUNTER — Other Ambulatory Visit: Payer: Self-pay

## 2021-04-07 VITALS — BP 122/82 | HR 55 | Temp 97.8°F | Resp 16 | Ht 66.0 in | Wt 163.4 lb

## 2021-04-07 DIAGNOSIS — H409 Unspecified glaucoma: Secondary | ICD-10-CM | POA: Diagnosis not present

## 2021-04-07 DIAGNOSIS — Z131 Encounter for screening for diabetes mellitus: Secondary | ICD-10-CM

## 2021-04-07 DIAGNOSIS — E785 Hyperlipidemia, unspecified: Secondary | ICD-10-CM | POA: Diagnosis not present

## 2021-04-07 DIAGNOSIS — I10 Essential (primary) hypertension: Secondary | ICD-10-CM | POA: Diagnosis not present

## 2021-04-07 LAB — COMPREHENSIVE METABOLIC PANEL
ALT: 32 U/L (ref 0–53)
AST: 29 U/L (ref 0–37)
Albumin: 4.2 g/dL (ref 3.5–5.2)
Alkaline Phosphatase: 70 U/L (ref 39–117)
BUN: 10 mg/dL (ref 6–23)
CO2: 22 mEq/L (ref 19–32)
Calcium: 9.8 mg/dL (ref 8.4–10.5)
Chloride: 109 mEq/L (ref 96–112)
Creatinine, Ser: 1.11 mg/dL (ref 0.40–1.50)
GFR: 66.03 mL/min (ref >60.00)
Glucose, Bld: 153 mg/dL — ABNORMAL HIGH (ref 70–99)
Potassium: 3.7 mEq/L (ref 3.5–5.1)
Sodium: 140 mEq/L (ref 135–145)
Total Bilirubin: 1.1 mg/dL (ref 0.2–1.2)
Total Protein: 7.3 g/dL (ref 6.0–8.3)

## 2021-04-07 LAB — CBC
HCT: 46 % (ref 39.0–52.0)
Hemoglobin: 15.3 g/dL (ref 13.0–17.0)
MCHC: 33.4 g/dL (ref 30.0–36.0)
MCV: 86.8 fl (ref 78.0–100.0)
Platelets: 172 10*3/uL (ref 150.0–400.0)
RBC: 5.3 Mil/uL (ref 4.22–5.81)
RDW: 13.9 % (ref 11.5–15.5)
WBC: 5.2 10*3/uL (ref 4.0–10.5)

## 2021-04-07 LAB — LIPID PANEL
Cholesterol: 139 mg/dL (ref 0–200)
HDL: 39.1 mg/dL (ref >39.00)
LDL Cholesterol: 67 mg/dL (ref 0–99)
NonHDL: 99.93
Total CHOL/HDL Ratio: 4
Triglycerides: 164 mg/dL — ABNORMAL HIGH (ref 0.0–149.0)
VLDL: 32.8 mg/dL (ref 0.0–40.0)

## 2021-04-28 ENCOUNTER — Ambulatory Visit: Payer: Medicare Other | Admitting: Family Medicine

## 2021-06-20 ENCOUNTER — Other Ambulatory Visit: Payer: Self-pay | Admitting: Family Medicine

## 2021-06-20 DIAGNOSIS — E785 Hyperlipidemia, unspecified: Secondary | ICD-10-CM

## 2021-06-20 DIAGNOSIS — I10 Essential (primary) hypertension: Secondary | ICD-10-CM

## 2021-08-05 DIAGNOSIS — R972 Elevated prostate specific antigen [PSA]: Secondary | ICD-10-CM | POA: Diagnosis not present

## 2021-08-12 DIAGNOSIS — R3912 Poor urinary stream: Secondary | ICD-10-CM | POA: Diagnosis not present

## 2021-08-12 DIAGNOSIS — R972 Elevated prostate specific antigen [PSA]: Secondary | ICD-10-CM | POA: Diagnosis not present

## 2021-08-12 DIAGNOSIS — N401 Enlarged prostate with lower urinary tract symptoms: Secondary | ICD-10-CM | POA: Diagnosis not present

## 2021-08-25 DIAGNOSIS — H401111 Primary open-angle glaucoma, right eye, mild stage: Secondary | ICD-10-CM | POA: Diagnosis not present

## 2021-09-08 DIAGNOSIS — H25813 Combined forms of age-related cataract, bilateral: Secondary | ICD-10-CM | POA: Diagnosis not present

## 2021-09-08 DIAGNOSIS — H401123 Primary open-angle glaucoma, left eye, severe stage: Secondary | ICD-10-CM | POA: Diagnosis not present

## 2021-09-08 DIAGNOSIS — H401112 Primary open-angle glaucoma, right eye, moderate stage: Secondary | ICD-10-CM | POA: Diagnosis not present

## 2021-12-14 ENCOUNTER — Other Ambulatory Visit: Payer: Self-pay | Admitting: Family Medicine

## 2021-12-14 DIAGNOSIS — E785 Hyperlipidemia, unspecified: Secondary | ICD-10-CM

## 2021-12-14 DIAGNOSIS — I10 Essential (primary) hypertension: Secondary | ICD-10-CM

## 2021-12-16 ENCOUNTER — Other Ambulatory Visit: Payer: Self-pay | Admitting: Family Medicine

## 2021-12-16 DIAGNOSIS — E785 Hyperlipidemia, unspecified: Secondary | ICD-10-CM

## 2021-12-16 DIAGNOSIS — I10 Essential (primary) hypertension: Secondary | ICD-10-CM

## 2022-01-19 DIAGNOSIS — H401112 Primary open-angle glaucoma, right eye, moderate stage: Secondary | ICD-10-CM | POA: Diagnosis not present

## 2022-01-19 DIAGNOSIS — H25813 Combined forms of age-related cataract, bilateral: Secondary | ICD-10-CM | POA: Diagnosis not present

## 2022-01-19 DIAGNOSIS — H401123 Primary open-angle glaucoma, left eye, severe stage: Secondary | ICD-10-CM | POA: Diagnosis not present

## 2022-02-07 DIAGNOSIS — R972 Elevated prostate specific antigen [PSA]: Secondary | ICD-10-CM | POA: Diagnosis not present

## 2022-02-17 DIAGNOSIS — R3912 Poor urinary stream: Secondary | ICD-10-CM | POA: Diagnosis not present

## 2022-02-17 DIAGNOSIS — R972 Elevated prostate specific antigen [PSA]: Secondary | ICD-10-CM | POA: Diagnosis not present

## 2022-02-17 DIAGNOSIS — N401 Enlarged prostate with lower urinary tract symptoms: Secondary | ICD-10-CM | POA: Diagnosis not present

## 2022-02-17 DIAGNOSIS — N402 Nodular prostate without lower urinary tract symptoms: Secondary | ICD-10-CM | POA: Diagnosis not present

## 2022-03-02 DIAGNOSIS — H401112 Primary open-angle glaucoma, right eye, moderate stage: Secondary | ICD-10-CM | POA: Diagnosis not present

## 2022-03-02 DIAGNOSIS — H25813 Combined forms of age-related cataract, bilateral: Secondary | ICD-10-CM | POA: Diagnosis not present

## 2022-03-02 DIAGNOSIS — H401123 Primary open-angle glaucoma, left eye, severe stage: Secondary | ICD-10-CM | POA: Diagnosis not present

## 2022-04-13 DIAGNOSIS — H25813 Combined forms of age-related cataract, bilateral: Secondary | ICD-10-CM | POA: Diagnosis not present

## 2022-04-13 DIAGNOSIS — H401112 Primary open-angle glaucoma, right eye, moderate stage: Secondary | ICD-10-CM | POA: Diagnosis not present

## 2022-04-13 DIAGNOSIS — H401123 Primary open-angle glaucoma, left eye, severe stage: Secondary | ICD-10-CM | POA: Diagnosis not present

## 2022-05-12 ENCOUNTER — Other Ambulatory Visit: Payer: Self-pay | Admitting: Family Medicine

## 2022-05-12 DIAGNOSIS — I10 Essential (primary) hypertension: Secondary | ICD-10-CM

## 2022-05-25 DIAGNOSIS — H401123 Primary open-angle glaucoma, left eye, severe stage: Secondary | ICD-10-CM | POA: Diagnosis not present

## 2022-05-25 DIAGNOSIS — H25813 Combined forms of age-related cataract, bilateral: Secondary | ICD-10-CM | POA: Diagnosis not present

## 2022-05-25 DIAGNOSIS — H401112 Primary open-angle glaucoma, right eye, moderate stage: Secondary | ICD-10-CM | POA: Diagnosis not present

## 2022-06-03 ENCOUNTER — Other Ambulatory Visit: Payer: Self-pay | Admitting: Family Medicine

## 2022-06-03 DIAGNOSIS — I10 Essential (primary) hypertension: Secondary | ICD-10-CM

## 2022-06-03 DIAGNOSIS — E785 Hyperlipidemia, unspecified: Secondary | ICD-10-CM

## 2022-07-28 DIAGNOSIS — H401112 Primary open-angle glaucoma, right eye, moderate stage: Secondary | ICD-10-CM | POA: Diagnosis not present

## 2022-08-01 DIAGNOSIS — I10 Essential (primary) hypertension: Secondary | ICD-10-CM | POA: Diagnosis not present

## 2022-08-01 DIAGNOSIS — Z86010 Personal history of colonic polyps: Secondary | ICD-10-CM | POA: Diagnosis not present

## 2022-08-01 DIAGNOSIS — H409 Unspecified glaucoma: Secondary | ICD-10-CM | POA: Diagnosis not present

## 2022-08-02 DIAGNOSIS — H2513 Age-related nuclear cataract, bilateral: Secondary | ICD-10-CM | POA: Diagnosis not present

## 2022-08-02 DIAGNOSIS — H401112 Primary open-angle glaucoma, right eye, moderate stage: Secondary | ICD-10-CM | POA: Diagnosis not present

## 2022-08-02 DIAGNOSIS — H401123 Primary open-angle glaucoma, left eye, severe stage: Secondary | ICD-10-CM | POA: Diagnosis not present

## 2022-08-24 DIAGNOSIS — Z86010 Personal history of colonic polyps: Secondary | ICD-10-CM | POA: Diagnosis not present

## 2022-08-24 DIAGNOSIS — D122 Benign neoplasm of ascending colon: Secondary | ICD-10-CM | POA: Diagnosis not present

## 2022-08-24 DIAGNOSIS — D124 Benign neoplasm of descending colon: Secondary | ICD-10-CM | POA: Diagnosis not present

## 2022-08-24 DIAGNOSIS — K5730 Diverticulosis of large intestine without perforation or abscess without bleeding: Secondary | ICD-10-CM | POA: Diagnosis not present

## 2022-08-24 DIAGNOSIS — K635 Polyp of colon: Secondary | ICD-10-CM | POA: Diagnosis not present

## 2022-08-24 DIAGNOSIS — K5732 Diverticulitis of large intestine without perforation or abscess without bleeding: Secondary | ICD-10-CM | POA: Diagnosis not present

## 2022-08-24 LAB — HM COLONOSCOPY

## 2022-08-25 ENCOUNTER — Encounter: Payer: Self-pay | Admitting: Family Medicine

## 2022-09-05 DIAGNOSIS — H25813 Combined forms of age-related cataract, bilateral: Secondary | ICD-10-CM | POA: Diagnosis not present

## 2022-09-05 DIAGNOSIS — H401123 Primary open-angle glaucoma, left eye, severe stage: Secondary | ICD-10-CM | POA: Diagnosis not present

## 2022-09-05 DIAGNOSIS — H401112 Primary open-angle glaucoma, right eye, moderate stage: Secondary | ICD-10-CM | POA: Diagnosis not present

## 2022-09-09 ENCOUNTER — Other Ambulatory Visit: Payer: Self-pay | Admitting: Family Medicine

## 2022-09-09 DIAGNOSIS — E785 Hyperlipidemia, unspecified: Secondary | ICD-10-CM

## 2022-09-09 DIAGNOSIS — I10 Essential (primary) hypertension: Secondary | ICD-10-CM

## 2022-09-17 NOTE — Progress Notes (Unsigned)
[facility] [place], [place], [place] [address] [phone]  Date:  09/19/2022   Name:  Justin Walter   DOB:  Mar 23, 1946   MRN:  [id number]  PCP:  Darreld Mclean, MD    Chief Complaint: annual OV (Concerns/ questions: none /Flu shot today: declines/AWV due)   History of Present Illness:  Justin Walter is a 77 y.o. very pleasant male patient who presents with the following:  Pt seen today for an annual visit Last seen by myself 5/22- history of hypertension, hyperlipidemia, glaucoma He sees urology for PSA- he has an appt coming up Glaucoma - he had an eye procedure to relieve the pressure, this was helpful  He does not have pain  Enjoys walking for exercise - he tries to get out 3x a week and practices yoga twice a week-no chest pain or shortness of breath with exercise  Shingrix  Covid booster Flu shot -declines today Labs 5/22  Crestor Valsartan Amlodipine   He is the last surviving his 5 siblings  BP Readings from Last 3 Encounters:  09/19/22 (!) 150/90  04/07/21 122/82  10/26/20 122/76    Patient Active Problem List   Diagnosis Date Noted   Diverticulosis 08/16/2017   Elevated PSA 08/07/2017   Glaucoma 09/03/2013   HYPERLIPIDEMIA 01/20/2010   HYPERTENSION 01/20/2010   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    Past Medical History:  Diagnosis Date   Blood transfusion without reported diagnosis    Glaucoma    Hyperlipidemia    Hypertension    Ulcer     Past Surgical History:  Procedure Laterality Date   bleeding ulcer     1971   CYST EXCISION     forehead    Social History   Tobacco Use   Smoking status: Never   Smokeless tobacco: Never  Vaping Use   Vaping Use: Never used  Substance Use Topics   Alcohol use: Yes    Alcohol/week: 2.0 standard drinks of alcohol    Types: 2 Standard drinks or equivalent per week    Comment: occass   Drug use: No    Family History  Problem  Relation Age of Onset   Cancer Mother        pancreatic cancer   Stroke Father    Cancer Sister     Allergies  Allergen Reactions   Brimonidine Other (See Comments)    Follicular conjunctivitis   Lotrel [Amlodipine Besy-Benazepril Hcl] Swelling    Medication list has been reviewed and updated.  Current Outpatient Medications on File Prior to Visit  Medication Sig Dispense Refill   bimatoprost (LUMIGAN) 0.03 % ophthalmic solution 1 drop at bedtime.     desonide (DESOWEN) 0.05 % cream Apply topically 2 (two) times daily. 15 g 1   dorzolamide (TRUSOPT) 2 % ophthalmic solution 1 drop 3 (three) times daily.     LUMIGAN 0.01 % SOLN SMARTSIG:2 Drop(s) In Eye(s) Every Night     No current facility-administered medications on file prior to visit.    Review of Systems:  As per HPI- otherwise negative.   Physical Examination: Vitals:   09/19/22 1539 09/19/22 1607  BP: (!) 158/90 (!) 150/90  Pulse: 60   Resp: 18   Temp: 97.7 F (36.5 C)   SpO2: 97%    Vitals:   09/19/22 1539  Weight: 163 lb 12.8 oz (74.3 kg)  Height: '5\' 6"'$  (1.676 m)   Body mass index is  26.44 kg/m. Ideal Body Weight: Weight in (lb) to have BMI = 25: 154.6  GEN: no acute distress.  Normal weight, looks well and quite healthy for age 35: Atraumatic, Normocephalic.  Ears and Nose: No external deformity. CV: RRR, No M/G/R. No JVD. No thrill. No extra heart sounds. PULM: CTA B, no wheezes, crackles, rhonchi. No retractions. No resp. distress. No accessory muscle use. ABD: S, NT, ND, +BS. No rebound. No HSM. EXTR: No c/c/e PSYCH: Normally interactive. Conversant.  Recheck blood pressure no change  Assessment and Plan: Essential hypertension - Plan: CBC, Comprehensive metabolic panel, amLODipine (NORVASC) 5 MG tablet, valsartan (DIOVAN) 160 MG tablet, CT CARDIAC SCORING (SELF PAY ONLY)  Hyperlipidemia, unspecified hyperlipidemia type - Plan: Lipid panel, rosuvastatin (CRESTOR) 20 MG tablet, CT CARDIAC  SCORING (SELF PAY ONLY)  Screening for diabetes mellitus - Plan: Hemoglobin A1c  Elevated glucose - Plan: Hemoglobin A1c  Patient seen today for follow-up.  Blood pressure is elevated today, normally has been well controlled Refilled same medications, he will monitor his blood pressure at home and give me an update in a couple of weeks A1c pending Check cholesterol, he is taking Crestor.  Ordered CT coronary calcium Will plan further follow- up pending labs.   Signed Lamar Blinks, MD  Addendum 11/7, received labs and coronary calcium.  Message to patient Results for orders placed or performed in visit on 09/19/22  CBC  Result Value Ref Range   WBC 6.8 4.0 - 10.5 K/uL   RBC 5.30 4.22 - 5.81 Mil/uL   Platelets 180.0 150.0 - 400.0 K/uL   Hemoglobin 15.7 13.0 - 17.0 g/dL   HCT 47.2 39.0 - 52.0 %   MCV 89.1 78.0 - 100.0 fl   MCHC 33.2 30.0 - 36.0 g/dL   RDW 14.0 11.5 - 15.5 %  Comprehensive metabolic panel  Result Value Ref Range   Sodium 139 135 - 145 mEq/L   Potassium 4.2 3.5 - 5.1 mEq/L   Chloride 107 96 - 112 mEq/L   CO2 24 19 - 32 mEq/L   Glucose, Bld 65 (L) 70 - 99 mg/dL   BUN 15 6 - 23 mg/dL   Creatinine, Ser 1.25 0.40 - 1.50 mg/dL   Total Bilirubin 1.0 0.2 - 1.2 mg/dL   Alkaline Phosphatase 66 39 - 117 U/L   AST 37 0 - 37 U/L   ALT 32 0 - 53 U/L   Total Protein 7.9 6.0 - 8.3 g/dL   Albumin 4.5 3.5 - 5.2 g/dL   GFR 56.68 (L) >60.00 mL/min   Calcium 9.7 8.4 - 10.5 mg/dL  Hemoglobin A1c  Result Value Ref Range   Hgb A1c MFr Bld 5.7 4.6 - 6.5 %  Lipid panel  Result Value Ref Range   Cholesterol 144 0 - 200 mg/dL   Triglycerides 144.0 0.0 - 149.0 mg/dL   HDL 48.70 >39.00 mg/dL   VLDL 28.8 0.0 - 40.0 mg/dL   LDL Cholesterol 66 0 - 99 mg/dL   Total CHOL/HDL Ratio 3    NonHDL 95.18     CT CARDIAC SCORING (SELF PAY ONLY)  Addendum Date: 09/20/2022   ADDENDUM REPORT: 09/20/2022 09:43 EXAM: OVER-READ INTERPRETATION  CT CHEST The following report is an over-read  performed by radiologist Dr. Samara Snide [facility] on 09/20/2022. This over-read does not include interpretation of cardiac or coronary anatomy or pathology. The coronary calcium score interpretation by the cardiologist is attached. COMPARISON:  None. FINDINGS: Cardiovascular: Normal heart size. No significant pericardial  effusion/thickening. Great vessels are normal in course and caliber. Mediastinum/Nodes: Unremarkable esophagus. No pathologically enlarged mediastinal or hilar lymph nodes, noting limited sensitivity for the detection of hilar adenopathy on this noncontrast study. Lungs/Pleura: No pneumothorax. No pleural effusion. Small focus of punctate tree-in-bud type calcifications in the inferior right middle lobe compatible with chronic postinflammatory change. No acute consolidative airspace disease, lung masses or significant pulmonary nodules. Upper abdomen: Several simple liver cysts, largest 2.1 cm at the left liver dome. Musculoskeletal: No aggressive appearing focal osseous lesions. Mild thoracic spondylosis. IMPRESSION: No significant extracardiac findings. Electronically Signed   By: Ilona Sorrel M.D.   On: 09/20/2022 09:43   Result Date: 09/20/2022 CLINICAL DATA:  Risk stratification EXAM: Coronary Calcium Score TECHNIQUE: The patient was scanned on a Siemens Somatom 64 slice scanner. Axial non-contrast 77m slices were carried out through the heart. The data set was analyzed on a dedicated work station and scored using the [place] FINDINGS: Non-cardiac: No significant non cardiac findings on limited lung and soft tissue windows. [facility]. Ascending Aorta: Normal diameter 3.3 cm Pericardium: Normal Coronary arteries: Calcium noted in LAD and LCX LM 0 RCA 0 LAD 71.9 LCX 0.792 Total: 72.7 IMPRESSION: Coronary calcium score of 72.7. This was 364st percentile for age and sex matched control. PJenkins RougeElectronically Signed: By: PJenkins RougeM.D. On: 09/20/2022 09:22

## 2022-09-17 NOTE — Patient Instructions (Incomplete)
It was good to see you again today Recommend covid booster, dose of RSV this fall and flu shot!   Your BP is a bit high today- please check it at home a few times over the next couple of weeks.  I would like your BP to be under 135/85 (approx)- if consistently higher than this number please alert me and we can adjust your medication I will be in touch with your labs Please schedule your CT coronary calcium score with our imaging dept at your convenience   Assuming all is well and BP is ok we can visit in about 6 months

## 2022-09-19 ENCOUNTER — Ambulatory Visit (INDEPENDENT_AMBULATORY_CARE_PROVIDER_SITE_OTHER): Payer: Medicare Other | Admitting: Family Medicine

## 2022-09-19 VITALS — BP 150/90 | HR 60 | Temp 97.7°F | Resp 18 | Ht 66.0 in | Wt 163.8 lb

## 2022-09-19 DIAGNOSIS — Z131 Encounter for screening for diabetes mellitus: Secondary | ICD-10-CM | POA: Diagnosis not present

## 2022-09-19 DIAGNOSIS — E785 Hyperlipidemia, unspecified: Secondary | ICD-10-CM | POA: Diagnosis not present

## 2022-09-19 DIAGNOSIS — I10 Essential (primary) hypertension: Secondary | ICD-10-CM | POA: Diagnosis not present

## 2022-09-19 DIAGNOSIS — R7309 Other abnormal glucose: Secondary | ICD-10-CM

## 2022-09-19 MED ORDER — AMLODIPINE BESYLATE 5 MG PO TABS: 5.0000 mg | ORAL_TABLET | Freq: Every day | ORAL | 3 refills | Status: DC

## 2022-09-19 MED ORDER — ROSUVASTATIN CALCIUM 20 MG PO TABS: 20.0000 mg | ORAL_TABLET | Freq: Every day | ORAL | 3 refills | Status: DC

## 2022-09-19 MED ORDER — VALSARTAN 160 MG PO TABS: 160.0000 mg | ORAL_TABLET | Freq: Every day | ORAL | 3 refills | Status: DC

## 2022-09-20 ENCOUNTER — Ambulatory Visit (HOSPITAL_BASED_OUTPATIENT_CLINIC_OR_DEPARTMENT_OTHER)
Admission: RE | Admit: 2022-09-20 | Discharge: 2022-09-20 | Disposition: A | Discharge status: Home / Self Care | Payer: Medicare Other | Source: Ambulatory Visit | Attending: Family Medicine | Admitting: Family Medicine

## 2022-09-20 ENCOUNTER — Encounter: Payer: Self-pay | Admitting: Family Medicine

## 2022-09-20 DIAGNOSIS — E785 Hyperlipidemia, unspecified: Secondary | ICD-10-CM | POA: Insufficient documentation

## 2022-09-20 DIAGNOSIS — I10 Essential (primary) hypertension: Secondary | ICD-10-CM | POA: Insufficient documentation

## 2022-09-20 LAB — COMPREHENSIVE METABOLIC PANEL
ALT: 32 U/L (ref 0–53)
AST: 37 U/L (ref 0–37)
Albumin: 4.5 g/dL (ref 3.5–5.2)
Alkaline Phosphatase: 66 U/L (ref 39–117)
BUN: 15 mg/dL (ref 6–23)
CO2: 24 mEq/L (ref 19–32)
Calcium: 9.7 mg/dL (ref 8.4–10.5)
Chloride: 107 mEq/L (ref 96–112)
Creatinine, Ser: 1.25 mg/dL (ref 0.40–1.50)
GFR: 56.68 mL/min — ABNORMAL LOW (ref >60.00)
Glucose, Bld: 65 mg/dL — ABNORMAL LOW (ref 70–99)
Potassium: 4.2 mEq/L (ref 3.5–5.1)
Sodium: 139 mEq/L (ref 135–145)
Total Bilirubin: 1 mg/dL (ref 0.2–1.2)
Total Protein: 7.9 g/dL (ref 6.0–8.3)

## 2022-09-20 LAB — CBC
HCT: 47.2 % (ref 39.0–52.0)
Hemoglobin: 15.7 g/dL (ref 13.0–17.0)
MCHC: 33.2 g/dL (ref 30.0–36.0)
MCV: 89.1 fl (ref 78.0–100.0)
Platelets: 180 10*3/uL (ref 150.0–400.0)
RBC: 5.3 Mil/uL (ref 4.22–5.81)
RDW: 14 % (ref 11.5–15.5)
WBC: 6.8 10*3/uL (ref 4.0–10.5)

## 2022-09-20 LAB — LIPID PANEL
Cholesterol: 144 mg/dL (ref 0–200)
HDL: 48.7 mg/dL (ref >39.00)
LDL Cholesterol: 66 mg/dL (ref 0–99)
NonHDL: 95.18
Total CHOL/HDL Ratio: 3
Triglycerides: 144 mg/dL (ref 0.0–149.0)
VLDL: 28.8 mg/dL (ref 0.0–40.0)

## 2022-09-20 LAB — HEMOGLOBIN A1C: Hgb A1c MFr Bld: 5.7 % (ref 4.6–6.5)

## 2022-09-22 DIAGNOSIS — H2513 Age-related nuclear cataract, bilateral: Secondary | ICD-10-CM | POA: Diagnosis not present

## 2022-09-22 DIAGNOSIS — H524 Presbyopia: Secondary | ICD-10-CM | POA: Diagnosis not present

## 2022-09-22 DIAGNOSIS — L239 Allergic contact dermatitis, unspecified cause: Secondary | ICD-10-CM | POA: Diagnosis not present

## 2022-09-22 DIAGNOSIS — H401123 Primary open-angle glaucoma, left eye, severe stage: Secondary | ICD-10-CM | POA: Diagnosis not present

## 2022-09-22 DIAGNOSIS — H401112 Primary open-angle glaucoma, right eye, moderate stage: Secondary | ICD-10-CM | POA: Diagnosis not present

## 2022-09-29 ENCOUNTER — Ambulatory Visit: Payer: Medicare Other | Admitting: Family Medicine

## 2022-09-29 DIAGNOSIS — Z23 Encounter for immunization: Secondary | ICD-10-CM | POA: Diagnosis not present

## 2022-11-18 DIAGNOSIS — Z23 Encounter for immunization: Secondary | ICD-10-CM | POA: Diagnosis not present

## 2022-12-01 DIAGNOSIS — R972 Elevated prostate specific antigen [PSA]: Secondary | ICD-10-CM | POA: Diagnosis not present

## 2022-12-08 DIAGNOSIS — R972 Elevated prostate specific antigen [PSA]: Secondary | ICD-10-CM | POA: Diagnosis not present

## 2022-12-08 DIAGNOSIS — N402 Nodular prostate without lower urinary tract symptoms: Secondary | ICD-10-CM | POA: Diagnosis not present

## 2022-12-08 DIAGNOSIS — N401 Enlarged prostate with lower urinary tract symptoms: Secondary | ICD-10-CM | POA: Diagnosis not present

## 2022-12-08 DIAGNOSIS — R3912 Poor urinary stream: Secondary | ICD-10-CM | POA: Diagnosis not present

## 2023-01-03 NOTE — Progress Notes (Signed)
[facility] Quality Team Note  Name: Marsha Veness Date of Birth: 09/16/1946 MRN: [id number] Date: 01/03/2023  [facility] Quality Team has reviewed this patient's chart, please see recommendations below:  [facility] Quality Other; ([employer]. PATIENT NEEDS BLOOD PRESSURE READING BELOW 140/90. SCHEDULED NURSE VISIT APPT 01/04/2023 11:15 AM)

## 2023-01-04 ENCOUNTER — Ambulatory Visit (INDEPENDENT_AMBULATORY_CARE_PROVIDER_SITE_OTHER): Payer: Medicare Other

## 2023-01-04 DIAGNOSIS — I10 Essential (primary) hypertension: Secondary | ICD-10-CM | POA: Diagnosis not present

## 2023-01-04 NOTE — Progress Notes (Signed)
Verbal order for BP check obtained from PCP.

## 2023-01-04 NOTE — Progress Notes (Signed)
Pt here for Blood pressure check per Dr.Copland   Pt currently takes: Amlodipine, Valsartan   Pt reports compliance with medication.  BP today @ = 160/90 left arm manually 171/83 158/88 manually, last reading 148/82 HR = 84  Pt advised per Dr. Lorelei Pont to increase Valsartan to one and half tablets for a total 240 MG and continue to monitor blood pressure at home over the next one to two weeks and let PCP know how blood pressure is doing.

## 2023-01-13 ENCOUNTER — Encounter: Payer: Self-pay | Admitting: Family Medicine

## 2023-01-26 ENCOUNTER — Encounter: Payer: Self-pay | Admitting: Family Medicine

## 2023-02-07 DIAGNOSIS — H401123 Primary open-angle glaucoma, left eye, severe stage: Secondary | ICD-10-CM | POA: Diagnosis not present

## 2023-03-07 ENCOUNTER — Encounter: Payer: Self-pay | Admitting: Family Medicine

## 2023-03-08 ENCOUNTER — Encounter: Payer: Self-pay | Admitting: Family Medicine

## 2023-03-15 NOTE — Patient Instructions (Addendum)
It was great to see you again today, I will be in touch with your labs.  Assuming all is well please see me in about 6 months  I would recommend the new shingles vaccine series, Shingrix if not done already.    I am so sorry for loss of your brother

## 2023-03-15 NOTE — Progress Notes (Signed)
[facility] [address], [address] [place], [place] [address] [phone]  Date:  03/20/2023   Name:  Justin Walter   DOB:  27-Sep-1946   MRN:  [id number]  PCP:  Pearline Cables, MD    Chief Complaint: 6 month follow up (Concerns/ questions: none/AWV due)   History of Present Illness:  Justin Walter is a 77 y.o. very pleasant male patient who presents with the following:  Patient seen today for periodic follow-up-  history of hypertension, hyperlipidemia, glaucoma He sees urology for PSA-he has an appt  Most recent visit with myself was in November We did a CT coronary calcium last fall, 31st percentile-he is on Crestor  Enjoys walking and yoga for exercise  He is checking his BP at home  129/73 on recheck here today - he brings in his list of home readings and his BP has generally been under good control 130s/75 approx  He notes his readings may go up when he is feeling anxious -he is the youngest of 5 siblings.  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 coming up  Shingrix- he plans to update soon  COVID booster- he thinks UTD  Full lab panel done in November  Amlodipine 5 Eyedrops for glaucoma Crestor Diovan 160mg   Patient Active Problem List   Diagnosis Date Noted   Diverticulosis 08/16/2017   Elevated PSA 08/07/2017   Glaucoma 09/03/2013   HYPERLIPIDEMIA 01/20/2010   HYPERTENSION 01/20/2010   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    Past Medical History:  Diagnosis Date   Blood transfusion without reported diagnosis    Glaucoma    Hyperlipidemia    Hypertension    Ulcer     Past Surgical History:  Procedure Laterality Date   bleeding ulcer     1971   CYST EXCISION      forehead    Social History   Tobacco Use   Smoking status: Never   Smokeless tobacco: Never  Vaping Use   Vaping Use: Never used  Substance Use Topics   Alcohol use: Yes    Alcohol/week: 2.0 standard drinks of alcohol    Types: 2 Standard drinks or equivalent per week    Comment: occass   Drug use: No    Family History  Problem Relation Age of Onset   Cancer Mother        pancreatic cancer   Stroke Father    Cancer Sister     Allergies  Allergen Reactions   Brimonidine Other (See Comments)    Follicular conjunctivitis   Lotrel [Amlodipine Besy-Benazepril Hcl] Swelling    Medication list has been reviewed and updated.  Current Outpatient Medications on File Prior to Visit  Medication Sig Dispense Refill   amLODipine (NORVASC) 5 MG tablet Take 1 tablet (5 mg total) by mouth daily. 90 tablet 3   bimatoprost (LUMIGAN) 0.03 % ophthalmic solution 1 drop at bedtime.     desonide (DESOWEN) 0.05 % cream Apply topically 2 (two) times daily. 15 g 1   dorzolamide (TRUSOPT) 2 % ophthalmic solution 1 drop 3 (three) times daily.     LUMIGAN 0.01 % SOLN SMARTSIG:2 Drop(s) In Eye(s) Every Night     rosuvastatin (CRESTOR)  20 MG tablet Take 1 tablet (20 mg total) by mouth daily. 90 tablet 3   valsartan (DIOVAN) 160 MG tablet Take 1 tablet (160 mg total) by mouth daily. 90 tablet 3   No current facility-administered medications on file prior to visit.    Review of Systems:  As per HPI- otherwise negative.   Physical Examination: Vitals:   03/20/23 0911 03/20/23 0931  BP: (!) 164/82 (!) 140/80  Pulse: 72   Resp: 18   Temp: 97.7 F (36.5 C)   SpO2: 97%    Vitals:   03/20/23 0911  Weight: 166 lb (75.3 kg)  Height: 5\' 6"  (1.676 m)   Body mass index is 26.79 kg/m. Ideal Body Weight: Weight in (lb) to have BMI = 25: 154.6  GEN: no acute distress.  Minimal overweight, looks well HEENT: Atraumatic, Normocephalic.  Bilateral TM wnl, oropharynx normal.  PEERL,EOMI.   Ears  and Nose: No external deformity. CV: RRR, No M/G/R. No JVD. No thrill. No extra heart sounds. PULM: CTA B, no wheezes, crackles, rhonchi. No retractions. No resp. distress. No accessory muscle use. ABD: S, NT, ND, +BS. No rebound. No HSM. EXTR: No c/c/e PSYCH: Normally interactive. Conversant.    Assessment and Plan: Essential hypertension - Plan: Basic metabolic panel, CBC  Hyperlipidemia, unspecified hyperlipidemia type  Elevated glucose - Plan: Hemoglobin A1c  Glaucoma, unspecified glaucoma type, unspecified laterality  Blood pressure is mildly elevated today, but patient has an extensive list of home readings which show good control.  For the time being we will continue his medications, he will continue to monitor.  Lab work is pending as above Teacher, English as a foreign language for hyperlipidemia Follow-up on A1c today Glaucoma is managed by ophthalmology Will plan further follow- up pending labs. 32-month recheck assuming all is well   Signed Abbe Amsterdam, MD  Received labs as below Results for orders placed or performed in visit on 03/20/23  Basic metabolic panel  Result Value Ref Range   Sodium 139 135 - 145 mEq/L   Potassium 3.6 3.5 - 5.1 mEq/L   Chloride 108 96 - 112 mEq/L   CO2 21 19 - 32 mEq/L   Glucose, Bld 149 (H) 70 - 99 mg/dL   BUN 14 6 - 23 mg/dL   Creatinine, Ser 4.09 0.40 - 1.50 mg/dL   GFR 81.19 (L) >14.78 mL/min   Calcium 9.4 8.4 - 10.5 mg/dL  CBC  Result Value Ref Range   WBC 5.8 4.0 - 10.5 K/uL   RBC 5.15 4.22 - 5.81 Mil/uL   Platelets 187.0 150.0 - 400.0 K/uL   Hemoglobin 15.3 13.0 - 17.0 g/dL   HCT 29.5 62.1 - 30.8 %   MCV 86.9 78.0 - 100.0 fl   MCHC 34.1 30.0 - 36.0 g/dL   RDW 65.7 84.6 - 96.2 %  Hemoglobin A1c  Result Value Ref Range   Hgb A1c MFr Bld 5.8 4.6 - 6.5 %

## 2023-03-20 ENCOUNTER — Encounter: Payer: Self-pay | Admitting: Family Medicine

## 2023-03-20 ENCOUNTER — Ambulatory Visit (INDEPENDENT_AMBULATORY_CARE_PROVIDER_SITE_OTHER): Payer: Medicare Other | Admitting: Family Medicine

## 2023-03-20 VITALS — BP 140/80 | HR 72 | Temp 97.7°F | Resp 18 | Ht 66.0 in | Wt 166.0 lb

## 2023-03-20 DIAGNOSIS — I10 Essential (primary) hypertension: Secondary | ICD-10-CM | POA: Diagnosis not present

## 2023-03-20 DIAGNOSIS — E785 Hyperlipidemia, unspecified: Secondary | ICD-10-CM

## 2023-03-20 DIAGNOSIS — R7309 Other abnormal glucose: Secondary | ICD-10-CM

## 2023-03-20 DIAGNOSIS — H409 Unspecified glaucoma: Secondary | ICD-10-CM | POA: Diagnosis not present

## 2023-03-20 LAB — BASIC METABOLIC PANEL
BUN: 14 mg/dL (ref 6–23)
CO2: 21 mEq/L (ref 19–32)
Calcium: 9.4 mg/dL (ref 8.4–10.5)
Chloride: 108 mEq/L (ref 96–112)
Creatinine, Ser: 1.23 mg/dL (ref 0.40–1.50)
GFR: 57.59 mL/min — ABNORMAL LOW (ref >60.00)
Glucose, Bld: 149 mg/dL — ABNORMAL HIGH (ref 70–99)
Potassium: 3.6 mEq/L (ref 3.5–5.1)
Sodium: 139 mEq/L (ref 135–145)

## 2023-03-20 LAB — CBC
HCT: 44.7 % (ref 39.0–52.0)
Hemoglobin: 15.3 g/dL (ref 13.0–17.0)
MCHC: 34.1 g/dL (ref 30.0–36.0)
MCV: 86.9 fl (ref 78.0–100.0)
Platelets: 187 10*3/uL (ref 150.0–400.0)
RBC: 5.15 Mil/uL (ref 4.22–5.81)
RDW: 13.7 % (ref 11.5–15.5)
WBC: 5.8 10*3/uL (ref 4.0–10.5)

## 2023-03-20 LAB — HEMOGLOBIN A1C: Hgb A1c MFr Bld: 5.8 % (ref 4.6–6.5)

## 2023-04-13 DIAGNOSIS — R972 Elevated prostate specific antigen [PSA]: Secondary | ICD-10-CM | POA: Diagnosis not present

## 2023-04-20 DIAGNOSIS — N401 Enlarged prostate with lower urinary tract symptoms: Secondary | ICD-10-CM | POA: Diagnosis not present

## 2023-04-20 DIAGNOSIS — R3912 Poor urinary stream: Secondary | ICD-10-CM | POA: Diagnosis not present

## 2023-04-20 DIAGNOSIS — R972 Elevated prostate specific antigen [PSA]: Secondary | ICD-10-CM | POA: Diagnosis not present

## 2023-06-13 DIAGNOSIS — H401112 Primary open-angle glaucoma, right eye, moderate stage: Secondary | ICD-10-CM | POA: Diagnosis not present

## 2023-06-13 DIAGNOSIS — H25813 Combined forms of age-related cataract, bilateral: Secondary | ICD-10-CM | POA: Diagnosis not present

## 2023-06-13 DIAGNOSIS — H401123 Primary open-angle glaucoma, left eye, severe stage: Secondary | ICD-10-CM | POA: Diagnosis not present

## 2023-06-20 DIAGNOSIS — N1831 Chronic kidney disease, stage 3a: Secondary | ICD-10-CM | POA: Diagnosis not present

## 2023-06-20 DIAGNOSIS — I10 Essential (primary) hypertension: Secondary | ICD-10-CM | POA: Diagnosis not present

## 2023-06-20 DIAGNOSIS — R55 Syncope and collapse: Secondary | ICD-10-CM | POA: Diagnosis not present

## 2023-06-20 DIAGNOSIS — I2699 Other pulmonary embolism without acute cor pulmonale: Secondary | ICD-10-CM | POA: Diagnosis not present

## 2023-06-20 DIAGNOSIS — E785 Hyperlipidemia, unspecified: Secondary | ICD-10-CM | POA: Diagnosis not present

## 2023-06-23 DIAGNOSIS — E785 Hyperlipidemia, unspecified: Secondary | ICD-10-CM | POA: Diagnosis not present

## 2023-06-23 DIAGNOSIS — I129 Hypertensive chronic kidney disease with stage 1 through stage 4 chronic kidney disease, or unspecified chronic kidney disease: Secondary | ICD-10-CM | POA: Diagnosis not present

## 2023-06-23 DIAGNOSIS — H401123 Primary open-angle glaucoma, left eye, severe stage: Secondary | ICD-10-CM | POA: Diagnosis not present

## 2023-06-23 DIAGNOSIS — H2512 Age-related nuclear cataract, left eye: Secondary | ICD-10-CM | POA: Diagnosis not present

## 2023-06-23 DIAGNOSIS — N189 Chronic kidney disease, unspecified: Secondary | ICD-10-CM | POA: Diagnosis not present

## 2023-08-16 DIAGNOSIS — Z961 Presence of intraocular lens: Secondary | ICD-10-CM | POA: Diagnosis not present

## 2023-08-16 DIAGNOSIS — H25811 Combined forms of age-related cataract, right eye: Secondary | ICD-10-CM | POA: Diagnosis not present

## 2023-08-16 DIAGNOSIS — H401123 Primary open-angle glaucoma, left eye, severe stage: Secondary | ICD-10-CM | POA: Diagnosis not present

## 2023-08-16 DIAGNOSIS — L239 Allergic contact dermatitis, unspecified cause: Secondary | ICD-10-CM | POA: Diagnosis not present

## 2023-08-16 DIAGNOSIS — H401112 Primary open-angle glaucoma, right eye, moderate stage: Secondary | ICD-10-CM | POA: Diagnosis not present

## 2023-09-03 ENCOUNTER — Other Ambulatory Visit: Payer: Self-pay | Admitting: Family Medicine

## 2023-09-03 DIAGNOSIS — E785 Hyperlipidemia, unspecified: Secondary | ICD-10-CM

## 2023-09-03 DIAGNOSIS — I10 Essential (primary) hypertension: Secondary | ICD-10-CM

## 2023-09-13 DIAGNOSIS — H401112 Primary open-angle glaucoma, right eye, moderate stage: Secondary | ICD-10-CM | POA: Diagnosis not present

## 2023-09-13 DIAGNOSIS — H25811 Combined forms of age-related cataract, right eye: Secondary | ICD-10-CM | POA: Diagnosis not present

## 2023-09-13 DIAGNOSIS — H401123 Primary open-angle glaucoma, left eye, severe stage: Secondary | ICD-10-CM | POA: Diagnosis not present

## 2023-09-13 DIAGNOSIS — H02882 Meibomian gland dysfunction right lower eyelid: Secondary | ICD-10-CM | POA: Diagnosis not present

## 2023-09-13 DIAGNOSIS — L239 Allergic contact dermatitis, unspecified cause: Secondary | ICD-10-CM | POA: Diagnosis not present

## 2023-09-13 DIAGNOSIS — Z961 Presence of intraocular lens: Secondary | ICD-10-CM | POA: Diagnosis not present

## 2023-09-15 DIAGNOSIS — R972 Elevated prostate specific antigen [PSA]: Secondary | ICD-10-CM | POA: Diagnosis not present

## 2023-09-15 DIAGNOSIS — R7303 Prediabetes: Secondary | ICD-10-CM | POA: Insufficient documentation

## 2023-09-15 NOTE — Progress Notes (Signed)
[facility] [address], [address] [place], [place] [address] [phone]  Date:  09/25/2023   Name:  Justin Walter   DOB:  July 10, 1946   MRN:  [id number]  PCP:  Pearline Cables, MD    Chief Complaint: 6 month follow up  (Concerns/ questions: none/Flu shot today: yes/AWV due/Shingrix, Td: Medicare pt)   History of Present Illness:  Justin Walter is a 77 y.o. very pleasant male patient who presents with the following:  Patient seen today for 60-month follow-up- history of hypertension, hyperlipidemia, glaucoma, prediabetes We have been following a minimal decline in GFR  Most recent visit with myself was in May of this year  Coronary calcium about 1 year ago, 31st percentile PSA is monitored by urology In the interim since our last visit he did undergo surgery for glaucoma per Dr. Wynelle Fanny- just the left eye  He saw his urologist, Dr. Mena Goes in June  Amlodipine 5 Crestor 20 Valsartan 160 daily  Seasonal flu shot- give today  COVID-19 booster- recommended  Tdap- he is not sure Shingrix- this was done at [facility] -update chart  RSV- recommended  He is fasting this am for labs   He will check his BP at home on occasion- it has looked ok  Tolerating amlodipine 5 ok- swelling with 10 mg  He enjoys walking 3x a week and doing yoga, golf  No CP or SOB  Never a smoker, occas etoh    Patient Active Problem List   Diagnosis Date Noted   Prediabetes 09/15/2023   Diverticulosis 08/16/2017   Elevated PSA 08/07/2017   Glaucoma 09/03/2013   HYPERLIPIDEMIA 01/20/2010   Essential hypertension 01/20/2010   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    Past Medical History:  Diagnosis Date   Blood transfusion without reported diagnosis    Glaucoma    Hyperlipidemia    Hypertension    Ulcer     Past Surgical History:  Procedure Laterality Date   bleeding ulcer     1971   CYST EXCISION     forehead    Social History    Tobacco Use   Smoking status: Never   Smokeless tobacco: Never  Vaping Use   Vaping status: Never Used  Substance Use Topics   Alcohol use: Yes    Alcohol/week: 2.0 standard drinks of alcohol    Types: 2 Standard drinks or equivalent per week    Comment: occass   Drug use: No    Family History  Problem Relation Age of Onset   Cancer Mother        pancreatic cancer   Stroke Father    Cancer Sister     Allergies  Allergen Reactions   Brimonidine Other (See Comments)    Follicular conjunctivitis   Lotrel [Amlodipine Besy-Benazepril Hcl] Swelling    Medication list has been reviewed and updated.  Current Outpatient Medications on File Prior to Visit  Medication Sig Dispense Refill   amLODipine (NORVASC) 5 MG tablet Take 1 tablet (5 mg total) by mouth daily. 90 tablet 0   bimatoprost (LUMIGAN) 0.03 % ophthalmic solution 1 drop at bedtime.     desonide (DESOWEN) 0.05 % cream Apply topically 2 (two) times daily. 15 g 1   dorzolamide (TRUSOPT) 2 % ophthalmic solution 1 drop 3 (three) times daily.     LUMIGAN 0.01 % SOLN SMARTSIG:2 Drop(s) In Eye(s) Every Night     rosuvastatin (CRESTOR) 20 MG tablet Take 1  tablet (20 mg total) by mouth daily. 90 tablet 0   valsartan (DIOVAN) 160 MG tablet Take 1 tablet (160 mg total) by mouth daily. 90 tablet 3   No current facility-administered medications on file prior to visit.    Review of Systems:  As per HPI- otherwise negative.   Physical Examination: Vitals:   09/25/23 0815  BP: 124/76  Pulse: 73  Resp: 18  Temp: 97.8 F (36.6 C)  SpO2: 97%   Vitals:   09/25/23 0815  Weight: 162 lb 9.6 oz (73.8 kg)  Height: 5\' 6"  (1.676 m)   Body mass index is 26.24 kg/m. Ideal Body Weight: Weight in (lb) to have BMI = 25: 154.6  GEN: no acute distress. Normal weight, looks well  HEENT: Atraumatic, Normocephalic.  Bilateral TM wnl, oropharynx normal.  PEERL,EOMI.  Left eye is inflamed  Ears and Nose: No external deformity. CV:  RRR, No M/G/R. No JVD. No thrill. No extra heart sounds. PULM: CTA B, no wheezes, crackles, rhonchi. No retractions. No resp. distress. No accessory muscle use. ABD: S, NT, ND, +BS. No rebound. No HSM. EXTR: No c/c/e PSYCH: Normally interactive. Conversant.    Assessment and Plan: Prediabetes - Plan: Comprehensive metabolic panel, Hemoglobin A1c  Essential hypertension - Plan: CBC, amLODipine (NORVASC) 5 MG tablet, valsartan (DIOVAN) 160 MG tablet  Hyperlipidemia, unspecified hyperlipidemia type - Plan: Lipid panel, rosuvastatin (CRESTOR) 20 MG tablet  Immunization due - Plan: Flu Vaccine Trivalent High Dose (Fluad)  Seen today for followup BP well controlled Monitor A1c and renal function, lipids Flu shot given. Recommended other indicated immunizations He will contact his optho today regarding his eye sx- asked him to please alert me if he is not able to get in touch with them  Plan to recheck in 6 months  Signed Abbe Amsterdam, MD  Received labs as below, message to patient  Results for orders placed or performed in visit on 09/25/23  CBC  Result Value Ref Range   WBC 6.7 4.0 - 10.5 K/uL   RBC 5.25 4.22 - 5.81 Mil/uL   Platelets 204.0 150.0 - 400.0 K/uL   Hemoglobin 15.6 13.0 - 17.0 g/dL   HCT 10.9 32.3 - 55.7 %   MCV 88.3 78.0 - 100.0 fl   MCHC 33.7 30.0 - 36.0 g/dL   RDW 32.2 02.5 - 42.7 %  Comprehensive metabolic panel  Result Value Ref Range   Sodium 139 135 - 145 mEq/L   Potassium 4.5 3.5 - 5.1 mEq/L   Chloride 108 96 - 112 mEq/L   CO2 23 19 - 32 mEq/L   Glucose, Bld 93 70 - 99 mg/dL   BUN 11 6 - 23 mg/dL   Creatinine, Ser 0.62 0.40 - 1.50 mg/dL   Total Bilirubin 1.5 (H) 0.2 - 1.2 mg/dL   Alkaline Phosphatase 79 39 - 117 U/L   AST 28 0 - 37 U/L   ALT 31 0 - 53 U/L   Total Protein 7.6 6.0 - 8.3 g/dL   Albumin 4.4 3.5 - 5.2 g/dL   GFR 37.62 (L) >83.15 mL/min   Calcium 9.7 8.4 - 10.5 mg/dL  Hemoglobin V7O  Result Value Ref Range   Hgb A1c MFr Bld 5.9  4.6 - 6.5 %  Lipid panel  Result Value Ref Range   Cholesterol 145 0 - 200 mg/dL   Triglycerides 160.7 0.0 - 149.0 mg/dL   HDL 37.10 >62.69 mg/dL   VLDL 48.5 0.0 - 46.2 mg/dL   LDL Cholesterol 70  0 - 99 mg/dL   Total CHOL/HDL Ratio 3    NonHDL 99.47

## 2023-09-15 NOTE — Patient Instructions (Addendum)
It was great to see you again today, I will be in touch with your lab work.  If not done already recommend updated COVID booster, dose of  RSV.  You may also be due for a tetanus booster- these can all be done at your pharmacy Flu shot given today  Assuming all is well we can plan to visit in about 6 months

## 2023-09-21 DIAGNOSIS — N401 Enlarged prostate with lower urinary tract symptoms: Secondary | ICD-10-CM | POA: Diagnosis not present

## 2023-09-21 DIAGNOSIS — R3912 Poor urinary stream: Secondary | ICD-10-CM | POA: Diagnosis not present

## 2023-09-21 DIAGNOSIS — R972 Elevated prostate specific antigen [PSA]: Secondary | ICD-10-CM | POA: Diagnosis not present

## 2023-09-25 ENCOUNTER — Ambulatory Visit (INDEPENDENT_AMBULATORY_CARE_PROVIDER_SITE_OTHER): Payer: Medicare Other | Admitting: Family Medicine

## 2023-09-25 ENCOUNTER — Encounter: Payer: Self-pay | Admitting: Family Medicine

## 2023-09-25 VITALS — BP 124/76 | HR 73 | Temp 97.8°F | Resp 18 | Ht 66.0 in | Wt 162.6 lb

## 2023-09-25 DIAGNOSIS — R7303 Prediabetes: Secondary | ICD-10-CM | POA: Diagnosis not present

## 2023-09-25 DIAGNOSIS — I10 Essential (primary) hypertension: Secondary | ICD-10-CM | POA: Diagnosis not present

## 2023-09-25 DIAGNOSIS — Z23 Encounter for immunization: Secondary | ICD-10-CM | POA: Diagnosis not present

## 2023-09-25 DIAGNOSIS — R17 Unspecified jaundice: Secondary | ICD-10-CM

## 2023-09-25 DIAGNOSIS — H409 Unspecified glaucoma: Secondary | ICD-10-CM

## 2023-09-25 DIAGNOSIS — E785 Hyperlipidemia, unspecified: Secondary | ICD-10-CM

## 2023-09-25 LAB — COMPREHENSIVE METABOLIC PANEL
ALT: 31 U/L (ref 0–53)
AST: 28 U/L (ref 0–37)
Albumin: 4.4 g/dL (ref 3.5–5.2)
Alkaline Phosphatase: 79 U/L (ref 39–117)
BUN: 11 mg/dL (ref 6–23)
CO2: 23 meq/L (ref 19–32)
Calcium: 9.7 mg/dL (ref 8.4–10.5)
Chloride: 108 meq/L (ref 96–112)
Creatinine, Ser: 1.31 mg/dL (ref 0.40–1.50)
GFR: 53.2 mL/min — ABNORMAL LOW (ref >60.00)
Glucose, Bld: 93 mg/dL (ref 70–99)
Potassium: 4.5 meq/L (ref 3.5–5.1)
Sodium: 139 meq/L (ref 135–145)
Total Bilirubin: 1.5 mg/dL — ABNORMAL HIGH (ref 0.2–1.2)
Total Protein: 7.6 g/dL (ref 6.0–8.3)

## 2023-09-25 LAB — LIPID PANEL
Cholesterol: 145 mg/dL (ref 0–200)
HDL: 46 mg/dL (ref >39.00)
LDL Cholesterol: 70 mg/dL (ref 0–99)
NonHDL: 99.47
Total CHOL/HDL Ratio: 3
Triglycerides: 145 mg/dL (ref 0.0–149.0)
VLDL: 29 mg/dL (ref 0.0–40.0)

## 2023-09-25 LAB — CBC
HCT: 46.4 % (ref 39.0–52.0)
Hemoglobin: 15.6 g/dL (ref 13.0–17.0)
MCHC: 33.7 g/dL (ref 30.0–36.0)
MCV: 88.3 fL (ref 78.0–100.0)
Platelets: 204 10*3/uL (ref 150.0–400.0)
RBC: 5.25 Mil/uL (ref 4.22–5.81)
RDW: 13.6 % (ref 11.5–15.5)
WBC: 6.7 10*3/uL (ref 4.0–10.5)

## 2023-09-25 LAB — HEMOGLOBIN A1C: Hgb A1c MFr Bld: 5.9 % (ref 4.6–6.5)

## 2023-09-25 MED ORDER — ROSUVASTATIN CALCIUM 20 MG PO TABS: 20.0000 mg | ORAL_TABLET | Freq: Every day | ORAL | 3 refills | Status: DC

## 2023-09-25 MED ORDER — AMLODIPINE BESYLATE 5 MG PO TABS: 5.0000 mg | ORAL_TABLET | Freq: Every day | ORAL | 3 refills | Status: DC

## 2023-09-25 MED ORDER — VALSARTAN 160 MG PO TABS: 160.0000 mg | ORAL_TABLET | Freq: Every day | ORAL | 3 refills | Status: DC

## 2023-10-03 DIAGNOSIS — H401123 Primary open-angle glaucoma, left eye, severe stage: Secondary | ICD-10-CM | POA: Diagnosis not present

## 2023-10-03 DIAGNOSIS — H02882 Meibomian gland dysfunction right lower eyelid: Secondary | ICD-10-CM | POA: Diagnosis not present

## 2023-10-03 DIAGNOSIS — L239 Allergic contact dermatitis, unspecified cause: Secondary | ICD-10-CM | POA: Diagnosis not present

## 2023-10-03 DIAGNOSIS — H401112 Primary open-angle glaucoma, right eye, moderate stage: Secondary | ICD-10-CM | POA: Diagnosis not present

## 2023-10-03 DIAGNOSIS — H25811 Combined forms of age-related cataract, right eye: Secondary | ICD-10-CM | POA: Diagnosis not present

## 2023-10-03 DIAGNOSIS — Z961 Presence of intraocular lens: Secondary | ICD-10-CM | POA: Diagnosis not present

## 2023-10-04 DIAGNOSIS — H401132 Primary open-angle glaucoma, bilateral, moderate stage: Secondary | ICD-10-CM | POA: Diagnosis not present

## 2023-10-04 DIAGNOSIS — Z961 Presence of intraocular lens: Secondary | ICD-10-CM | POA: Diagnosis not present

## 2023-10-04 DIAGNOSIS — H35033 Hypertensive retinopathy, bilateral: Secondary | ICD-10-CM | POA: Diagnosis not present

## 2023-10-04 DIAGNOSIS — H31422 Serous choroidal detachment, left eye: Secondary | ICD-10-CM | POA: Diagnosis not present

## 2023-10-04 DIAGNOSIS — H35371 Puckering of macula, right eye: Secondary | ICD-10-CM | POA: Diagnosis not present

## 2023-10-04 DIAGNOSIS — H43813 Vitreous degeneration, bilateral: Secondary | ICD-10-CM | POA: Diagnosis not present

## 2023-10-04 DIAGNOSIS — H401123 Primary open-angle glaucoma, left eye, severe stage: Secondary | ICD-10-CM | POA: Diagnosis not present

## 2023-10-04 DIAGNOSIS — H44442 Primary hypotony of left eye: Secondary | ICD-10-CM | POA: Diagnosis not present

## 2023-10-04 DIAGNOSIS — H3322 Serous retinal detachment, left eye: Secondary | ICD-10-CM | POA: Diagnosis not present

## 2023-10-09 ENCOUNTER — Other Ambulatory Visit: Payer: Self-pay | Admitting: Family Medicine

## 2023-10-10 DIAGNOSIS — H401123 Primary open-angle glaucoma, left eye, severe stage: Secondary | ICD-10-CM | POA: Diagnosis not present

## 2023-10-19 DIAGNOSIS — Z961 Presence of intraocular lens: Secondary | ICD-10-CM | POA: Diagnosis not present

## 2023-10-19 DIAGNOSIS — H401132 Primary open-angle glaucoma, bilateral, moderate stage: Secondary | ICD-10-CM | POA: Diagnosis not present

## 2023-10-19 DIAGNOSIS — H44442 Primary hypotony of left eye: Secondary | ICD-10-CM | POA: Diagnosis not present

## 2023-10-19 DIAGNOSIS — H35371 Puckering of macula, right eye: Secondary | ICD-10-CM | POA: Diagnosis not present

## 2023-10-19 DIAGNOSIS — H43813 Vitreous degeneration, bilateral: Secondary | ICD-10-CM | POA: Diagnosis not present

## 2023-10-19 DIAGNOSIS — H35033 Hypertensive retinopathy, bilateral: Secondary | ICD-10-CM | POA: Diagnosis not present

## 2023-10-31 DIAGNOSIS — H401123 Primary open-angle glaucoma, left eye, severe stage: Secondary | ICD-10-CM | POA: Diagnosis not present

## 2023-10-31 DIAGNOSIS — H26492 Other secondary cataract, left eye: Secondary | ICD-10-CM | POA: Diagnosis not present

## 2023-11-22 DIAGNOSIS — H401112 Primary open-angle glaucoma, right eye, moderate stage: Secondary | ICD-10-CM | POA: Diagnosis not present

## 2023-11-22 DIAGNOSIS — H02882 Meibomian gland dysfunction right lower eyelid: Secondary | ICD-10-CM | POA: Diagnosis not present

## 2023-11-22 DIAGNOSIS — H02839 Dermatochalasis of unspecified eye, unspecified eyelid: Secondary | ICD-10-CM | POA: Diagnosis not present

## 2023-11-22 DIAGNOSIS — H401123 Primary open-angle glaucoma, left eye, severe stage: Secondary | ICD-10-CM | POA: Diagnosis not present

## 2023-11-22 DIAGNOSIS — H524 Presbyopia: Secondary | ICD-10-CM | POA: Diagnosis not present

## 2023-11-22 DIAGNOSIS — Z961 Presence of intraocular lens: Secondary | ICD-10-CM | POA: Diagnosis not present

## 2023-11-22 DIAGNOSIS — L239 Allergic contact dermatitis, unspecified cause: Secondary | ICD-10-CM | POA: Diagnosis not present

## 2023-11-22 DIAGNOSIS — H31402 Unspecified choroidal detachment, left eye: Secondary | ICD-10-CM | POA: Diagnosis not present

## 2023-11-22 DIAGNOSIS — H25811 Combined forms of age-related cataract, right eye: Secondary | ICD-10-CM | POA: Diagnosis not present

## 2024-01-03 DIAGNOSIS — H31402 Unspecified choroidal detachment, left eye: Secondary | ICD-10-CM | POA: Diagnosis not present

## 2024-01-03 DIAGNOSIS — H401123 Primary open-angle glaucoma, left eye, severe stage: Secondary | ICD-10-CM | POA: Diagnosis not present

## 2024-01-30 DIAGNOSIS — H401112 Primary open-angle glaucoma, right eye, moderate stage: Secondary | ICD-10-CM | POA: Diagnosis not present

## 2024-01-30 DIAGNOSIS — L239 Allergic contact dermatitis, unspecified cause: Secondary | ICD-10-CM | POA: Diagnosis not present

## 2024-01-30 DIAGNOSIS — H02882 Meibomian gland dysfunction right lower eyelid: Secondary | ICD-10-CM | POA: Diagnosis not present

## 2024-01-30 DIAGNOSIS — H31402 Unspecified choroidal detachment, left eye: Secondary | ICD-10-CM | POA: Diagnosis not present

## 2024-01-30 DIAGNOSIS — H401123 Primary open-angle glaucoma, left eye, severe stage: Secondary | ICD-10-CM | POA: Diagnosis not present

## 2024-01-30 DIAGNOSIS — H524 Presbyopia: Secondary | ICD-10-CM | POA: Diagnosis not present

## 2024-01-30 DIAGNOSIS — Z961 Presence of intraocular lens: Secondary | ICD-10-CM | POA: Diagnosis not present

## 2024-01-30 DIAGNOSIS — H25811 Combined forms of age-related cataract, right eye: Secondary | ICD-10-CM | POA: Diagnosis not present

## 2024-03-04 DIAGNOSIS — H25811 Combined forms of age-related cataract, right eye: Secondary | ICD-10-CM | POA: Diagnosis not present

## 2024-03-04 DIAGNOSIS — Z961 Presence of intraocular lens: Secondary | ICD-10-CM | POA: Diagnosis not present

## 2024-03-04 DIAGNOSIS — H401112 Primary open-angle glaucoma, right eye, moderate stage: Secondary | ICD-10-CM | POA: Diagnosis not present

## 2024-03-04 DIAGNOSIS — H401123 Primary open-angle glaucoma, left eye, severe stage: Secondary | ICD-10-CM | POA: Diagnosis not present

## 2024-03-04 DIAGNOSIS — H02882 Meibomian gland dysfunction right lower eyelid: Secondary | ICD-10-CM | POA: Diagnosis not present

## 2024-03-04 DIAGNOSIS — L239 Allergic contact dermatitis, unspecified cause: Secondary | ICD-10-CM | POA: Diagnosis not present

## 2024-03-04 DIAGNOSIS — H31402 Unspecified choroidal detachment, left eye: Secondary | ICD-10-CM | POA: Diagnosis not present

## 2024-03-04 DIAGNOSIS — H524 Presbyopia: Secondary | ICD-10-CM | POA: Diagnosis not present

## 2024-03-14 DIAGNOSIS — R972 Elevated prostate specific antigen [PSA]: Secondary | ICD-10-CM | POA: Diagnosis not present

## 2024-03-17 NOTE — Patient Instructions (Addendum)
 It was good to see you again today- please see me in about 6 months  Recommend one dose of RSV vaccine and that you stay up to date on your covid shots  You are also due for a tetanus booster  -all of these can be done at your pharmacy   Keep an eye on your BP- if you are consistently running higher than 140/85 please let me know

## 2024-03-17 NOTE — Progress Notes (Signed)
 [facility] [address], [address] [place], [place] [address] [phone]  Date:  03/25/2024   Name:  Justin Walter   DOB:  07/09/1946   MRN:  [id number]  PCP:  Kaylee Partridge, MD    Chief Complaint: Follow-up   History of Present Illness:  Justin Walter is a 78 y.o. very pleasant male patient who presents with the following:  Pt seen today for periodic recheck-  history of hypertension, hyperlipidemia, glaucoma, prediabetes We have been following a minimal decline in GFR He did have surgery for his glaucoma about a year ago-his left eye.  This seems to have helped a lot he He is still on drops however for his right eye  Last seen by myself in November   PSA monitored by Derrick Fling  Amlodipine  5 Crestor  20 Valsartan  160 daily  He enjoys walking 3 miles 3x a week and doing yoga, golf -he is going to the driving range a lot   Tetanus -  recommended at pharmacy Most recent covid vaccine - same  Labs done in November - CMP, lipid, CBC, A1c  His blood pressure is running 130s/70s at home though he admits not checking regularly as of late    Patient Active Problem List   Diagnosis Date Noted   Prediabetes 09/15/2023   Diverticulosis 08/16/2017   Elevated PSA 08/07/2017   Glaucoma 09/03/2013   HYPERLIPIDEMIA 01/20/2010   Essential hypertension 01/20/2010   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    Past Medical History:  Diagnosis Date   Blood transfusion without reported diagnosis    Glaucoma    Hyperlipidemia    Hypertension    Ulcer     Past Surgical History:  Procedure Laterality Date   bleeding ulcer     1971   CYST EXCISION     forehead    Social History   Tobacco Use   Smoking status: Never   Smokeless tobacco: Never  Vaping Use   Vaping status: Never Used  Substance Use Topics   Alcohol use: Yes    Alcohol/week: 2.0 standard drinks of alcohol    Types: 2 Standard drinks or equivalent per week     Comment: occass   Drug use: No    Family History  Problem Relation Age of Onset   Cancer Mother        pancreatic cancer   Stroke Father    Cancer Sister     Allergies  Allergen Reactions   Brimonidine Other (See Comments)    Follicular conjunctivitis   Lotrel [Amlodipine  Besy-Benazepril Hcl] Swelling    Medication list has been reviewed and updated.  Current Outpatient Medications on File Prior to Visit  Medication Sig Dispense Refill   amLODipine  (NORVASC ) 5 MG tablet Take 1 tablet (5 mg total) by mouth daily. 90 tablet 3   bimatoprost (LUMIGAN) 0.03 % ophthalmic solution 1 drop at bedtime.     desonide  (DESOWEN ) 0.05 % cream Apply topically 2 (two) times daily. 15 g 1   dorzolamide (TRUSOPT) 2 % ophthalmic solution 1 drop 3 (three) times daily.     LUMIGAN 0.01 % SOLN SMARTSIG:2 Drop(s) In Eye(s) Every Night     rosuvastatin  (CRESTOR ) 20 MG tablet Take 1 tablet (20 mg total) by mouth daily. 90 tablet 3   valsartan  (DIOVAN ) 160 MG tablet Take 1 tablet (160 mg total) by mouth daily. 90 tablet 3   No current facility-administered medications on file prior to visit.  Review of Systems:  As per HPI- otherwise negative.   Physical Examination: Vitals:   03/25/24 0812 03/25/24 0824  BP: (!) 148/86 (!) 142/85  Pulse: (!) 54   Resp: 16   Temp: 97.9 F (36.6 C)   SpO2: 98%    Vitals:   03/25/24 0812  Weight: 162 lb 6.4 oz (73.7 kg)  Height: 5\' 7"  (1.702 m)   Body mass index is 25.44 kg/m. Ideal Body Weight: Weight in (lb) to have BMI = 25: 159.3  GEN: no acute distress.  Looks well, normal weight HEENT: Atraumatic, Normocephalic. Bilateral TM wnl, oropharynx normal.  PEERL,EOMI.   Ears and Nose: No external deformity. CV: RRR, No M/G/R. No JVD. No thrill. No extra heart sounds. PULM: CTA B, no wheezes, crackles, rhonchi. No retractions. No resp. distress. No accessory muscle use. ABD: S, NT, ND. No rebound. No HSM. EXTR: No c/c/e PSYCH: Normally  interactive. Conversant.  Patient has his right knee, medial posterior aspect may bother him when he attempts 1 particular yoga pose.  Otherwise it is not painful.  Exam is normal  Assessment and Plan: Prediabetes - Plan: Hemoglobin A1c  Essential hypertension - Plan: Basic metabolic panel with GFR  Hyperlipidemia, unspecified hyperlipidemia type  Immunization due  Glaucoma, unspecified glaucoma type, unspecified laterality  Acute pain of right knee  Patient seen today for follow-up.  Will evaluate prediabetes with A1c, blood pressure is mildly elevated.  Have asked him to monitor this at home.  If it continues to run high we can increase his amlodipine  which we had previously decreased from 10 mg Recommended age-appropriate immunizations  For the time being his knee really only bothers him with 1 particular yoga position.  Otherwise he can do all of his desired activities.  He does not wish to look at this further right now, he will let me know if anything changes  Will plan further follow- up pending labs.  Signed Gates Kasal, MD  Received labs, message to pt  Results for orders placed or performed in visit on 03/25/24  Basic metabolic panel with GFR   Collection Time: 03/25/24  8:38 AM  Result Value Ref Range   Sodium 141 135 - 145 mEq/L   Potassium 4.2 3.5 - 5.1 mEq/L   Chloride 110 96 - 112 mEq/L   CO2 23 19 - 32 mEq/L   Glucose, Bld 89 70 - 99 mg/dL   BUN 11 6 - 23 mg/dL   Creatinine, Ser 8.46 0.40 - 1.50 mg/dL   GFR 96.29 >52.84 mL/min   Calcium  9.6 8.4 - 10.5 mg/dL  Hemoglobin X3K   Collection Time: 03/25/24  8:38 AM  Result Value Ref Range   Hgb A1c MFr Bld 5.7 4.6 - 6.5 %

## 2024-03-21 DIAGNOSIS — R972 Elevated prostate specific antigen [PSA]: Secondary | ICD-10-CM | POA: Diagnosis not present

## 2024-03-21 DIAGNOSIS — N400 Enlarged prostate without lower urinary tract symptoms: Secondary | ICD-10-CM | POA: Diagnosis not present

## 2024-03-25 ENCOUNTER — Ambulatory Visit (INDEPENDENT_AMBULATORY_CARE_PROVIDER_SITE_OTHER): Payer: Medicare Other | Admitting: Family Medicine

## 2024-03-25 ENCOUNTER — Encounter: Payer: Self-pay | Admitting: Family Medicine

## 2024-03-25 VITALS — BP 142/85 | HR 54 | Temp 97.9°F | Resp 16 | Ht 67.0 in | Wt 162.4 lb

## 2024-03-25 DIAGNOSIS — E785 Hyperlipidemia, unspecified: Secondary | ICD-10-CM | POA: Diagnosis not present

## 2024-03-25 DIAGNOSIS — M25561 Pain in right knee: Secondary | ICD-10-CM

## 2024-03-25 DIAGNOSIS — R7303 Prediabetes: Secondary | ICD-10-CM

## 2024-03-25 DIAGNOSIS — I10 Essential (primary) hypertension: Secondary | ICD-10-CM | POA: Diagnosis not present

## 2024-03-25 DIAGNOSIS — H409 Unspecified glaucoma: Secondary | ICD-10-CM

## 2024-03-25 DIAGNOSIS — Z23 Encounter for immunization: Secondary | ICD-10-CM | POA: Diagnosis not present

## 2024-03-25 LAB — BASIC METABOLIC PANEL WITH GFR
BUN: 11 mg/dL (ref 6–23)
CO2: 23 meq/L (ref 19–32)
Calcium: 9.6 mg/dL (ref 8.4–10.5)
Chloride: 110 meq/L (ref 96–112)
Creatinine, Ser: 1.12 mg/dL (ref 0.40–1.50)
GFR: 63.98 mL/min (ref >60.00)
Glucose, Bld: 89 mg/dL (ref 70–99)
Potassium: 4.2 meq/L (ref 3.5–5.1)
Sodium: 141 meq/L (ref 135–145)

## 2024-03-25 LAB — HEMOGLOBIN A1C: Hgb A1c MFr Bld: 5.7 % (ref 4.6–6.5)

## 2024-04-17 DIAGNOSIS — H31402 Unspecified choroidal detachment, left eye: Secondary | ICD-10-CM | POA: Diagnosis not present

## 2024-04-17 DIAGNOSIS — H25811 Combined forms of age-related cataract, right eye: Secondary | ICD-10-CM | POA: Diagnosis not present

## 2024-04-17 DIAGNOSIS — H401123 Primary open-angle glaucoma, left eye, severe stage: Secondary | ICD-10-CM | POA: Diagnosis not present

## 2024-04-17 DIAGNOSIS — H401112 Primary open-angle glaucoma, right eye, moderate stage: Secondary | ICD-10-CM | POA: Diagnosis not present

## 2024-04-17 DIAGNOSIS — H02882 Meibomian gland dysfunction right lower eyelid: Secondary | ICD-10-CM | POA: Diagnosis not present

## 2024-04-17 DIAGNOSIS — L239 Allergic contact dermatitis, unspecified cause: Secondary | ICD-10-CM | POA: Diagnosis not present

## 2024-04-17 DIAGNOSIS — Z961 Presence of intraocular lens: Secondary | ICD-10-CM | POA: Diagnosis not present

## 2024-05-01 DIAGNOSIS — R972 Elevated prostate specific antigen [PSA]: Secondary | ICD-10-CM | POA: Diagnosis not present

## 2024-07-11 DIAGNOSIS — H02882 Meibomian gland dysfunction right lower eyelid: Secondary | ICD-10-CM | POA: Diagnosis not present

## 2024-07-11 DIAGNOSIS — H401112 Primary open-angle glaucoma, right eye, moderate stage: Secondary | ICD-10-CM | POA: Diagnosis not present

## 2024-07-11 DIAGNOSIS — L239 Allergic contact dermatitis, unspecified cause: Secondary | ICD-10-CM | POA: Diagnosis not present

## 2024-07-11 DIAGNOSIS — H31402 Unspecified choroidal detachment, left eye: Secondary | ICD-10-CM | POA: Diagnosis not present

## 2024-07-11 DIAGNOSIS — Z961 Presence of intraocular lens: Secondary | ICD-10-CM | POA: Diagnosis not present

## 2024-07-11 DIAGNOSIS — H02839 Dermatochalasis of unspecified eye, unspecified eyelid: Secondary | ICD-10-CM | POA: Diagnosis not present

## 2024-07-11 DIAGNOSIS — H401123 Primary open-angle glaucoma, left eye, severe stage: Secondary | ICD-10-CM | POA: Diagnosis not present

## 2024-07-11 DIAGNOSIS — H25811 Combined forms of age-related cataract, right eye: Secondary | ICD-10-CM | POA: Diagnosis not present

## 2024-08-01 ENCOUNTER — Telehealth: Payer: Self-pay | Admitting: Family Medicine

## 2024-08-01 NOTE — Telephone Encounter (Signed)
 Copied from CRM [id number]. Topic: Medicare AWV >> Aug 01, 2024 10:48 AM Nathanel DEL wrote: Reason for CRM: Called LVM 08/01/2024 to schedule AWV. Please schedule Virtual or Telehealth visits ONLY.   Nathanel Paschal; Care Guide Ambulatory Clinical Support [facility] Direct Dial: [phone]

## 2024-09-22 NOTE — Progress Notes (Signed)
 [facility] [address], [address] [place], [place] [address] [phone]  Date:  09/25/2024   Name:  Gerardo Caiazzo   DOB:  04/05/1946   MRN:  [id number]  PCP:  Watt Harlene BROCKS, MD    Chief Complaint: Follow-up (R knee I think something is going on, it doesn't bother me enough for surgery though )   History of Present Illness:  Duriel Deery is a 78 y.o. very pleasant male patient who presents with the following:  Patient seen today for periodic follow-up.  I saw him most recently in May -history of hypertension, hyperlipidemia, glaucoma, prediabetes We have been following a minimal decline in GFR Coronary calcium  2023, 31st percentile  He continues to follow-up with ophthalmology regarding his glaucoma PSA is monitored by urology, Dr. Nieves He enjoys walking, yoga, golf  Tetanus booster- recommended  Flu shot- give today  COVID booster RSV- recommended  He has completed Shingrix Amlodipine  5, valsartan  160, Crestor  20  Wt Readings from Last 3 Encounters:  09/25/24 163 lb 6.4 oz (74.1 kg)  03/25/24 162 lb 6.4 oz (73.7 kg)  09/25/23 162 lb 9.6 oz (73.8 kg)     Discussed the use of AI scribe software for clinical note transcription with the patient, who gave verbal consent to proceed.  History of Present Illness Sigmund Morera is a 78 year old male who presents with right knee discomfort during specific exercises.  He experiences discomfort in his right knee, particularly during a specific stretch exercise, described as 'a little bit stiff' and differing from the left knee. This issue has been present for some time, but he forgot to mention it during his last visit. The discomfort does not occur during other activities, including golfing, and there is no popping or locking of the knee. He has stopped doing the exercise that exacerbates the discomfort.  He reports lower blood pressure readings at home,  typically around 140/74 mmHg. He attributes the higher readings in the office to anxiety and has been calibrating his home machine to ensure accuracy.  He reports stable intraocular pressure and continues regular visits to his eye doctor. He uses a steroid eye drop once a week and has had previously low pressure in the eye. He has had eye surgery in the past.  No chest pain or difficulty breathing during exercise or walking.    Patient Active Problem List   Diagnosis Date Noted   Prediabetes 09/15/2023   Diverticulosis 08/16/2017   Elevated PSA 08/07/2017   Glaucoma 09/03/2013   HYPERLIPIDEMIA 01/20/2010   Essential hypertension 01/20/2010   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    Past Medical History:  Diagnosis Date   Blood transfusion without reported diagnosis    Glaucoma    Hyperlipidemia    Hypertension    Ulcer     Past Surgical History:  Procedure Laterality Date   bleeding ulcer     1971   CYST EXCISION     forehead    Social History   Tobacco Use   Smoking status: Never   Smokeless tobacco: Never  Vaping Use   Vaping status: Never Used  Substance Use Topics   Alcohol use: Yes    Alcohol/week: 2.0 standard drinks of alcohol    Types: 2 Standard drinks or equivalent per week    Comment: occass   Drug use: No    Family History  Problem Relation Age of Onset   Cancer Mother  pancreatic cancer   Stroke Father    Cancer Sister     Allergies  Allergen Reactions   Brimonidine Other (See Comments)    Follicular conjunctivitis   Lotrel [Amlodipine  Besy-Benazepril Hcl] Swelling    Medication list has been reviewed and updated.  Current Outpatient Medications on File Prior to Visit  Medication Sig Dispense Refill   amLODipine  (NORVASC ) 5 MG tablet Take 1 tablet (5 mg total) by mouth daily. 90 tablet 3   bimatoprost (LUMIGAN) 0.03 % ophthalmic solution 1 drop at bedtime.     desonide  (DESOWEN ) 0.05 % cream Apply topically 2 (two) times daily. 15 g 1    dorzolamide (TRUSOPT) 2 % ophthalmic solution 1 drop 3 (three) times daily.     LUMIGAN 0.01 % SOLN SMARTSIG:2 Drop(s) In Eye(s) Every Night     rosuvastatin  (CRESTOR ) 20 MG tablet Take 1 tablet (20 mg total) by mouth daily. 90 tablet 3   valsartan  (DIOVAN ) 160 MG tablet Take 1 tablet (160 mg total) by mouth daily. 90 tablet 3   No current facility-administered medications on file prior to visit.    Review of Systems:  As per HPI- otherwise negative.   Physical Examination: Vitals:   09/25/24 0812  BP: (!) 146/78  Pulse: 67  SpO2: 100%   Vitals:   09/25/24 0812  Weight: 163 lb 6.4 oz (74.1 kg)  Height: 5' 7 (1.702 m)   Body mass index is 25.59 kg/m. Ideal Body Weight: Weight in (lb) to have BMI = 25: 159.3  GEN: no acute distress. Normal weight, looks well  HEENT: Atraumatic, Normocephalic.  Ears and Nose: No external deformity. CV: RRR, No M/G/R. No JVD. No thrill. No extra heart sounds. PULM: CTA B, no wheezes, crackles, rhonchi. No retractions. No resp. distress. No accessory muscle use. ABD: S, NT, ND, +BS. No rebound. No HSM. EXTR: No c/c/e PSYCH: Normally interactive. Conversant.  Right knee displays minimal puffiness at the medial aspect, he describes discomfort if he does a particular yoga stretch which puts a lot of stress on the knee joint  Assessment and Plan: Prediabetes  Essential hypertension  Hyperlipidemia, unspecified hyperlipidemia type  Glaucoma, unspecified glaucoma type, unspecified laterality  Assessment & Plan Right knee pain likely due to osteoarthritis Intermittent pain during specific exercise, possible cartilage issue or arthritis. Prefers no further intervention unless symptoms worsen. - Avoid specific stretch exercise. - Consider x-ray or orthopedist consultation if symptoms worsen.  Essential hypertension Office blood pressure slightly elevated, likely anxiety-related. Home readings lower, possibly due to machine variance. -  Refill antihypertensive medications as discussed. - Monitor blood pressure at home regularly.  Glaucoma Well-managed with current treatment, no recent changes in intraocular pressure. - Continue current glaucoma medications. - Attend upcoming eye appointment.  General Health Maintenance Due for flu shot, RSV vaccine, and tetanus booster. - Administered flu shot today. - Schedule RSV vaccine and tetanus booster at pharmacy.  Signed Harlene Schroeder, MD  Received labs as below, message to patient Results for orders placed or performed in visit on 09/25/24  CBC   Collection Time: 09/25/24  8:47 AM  Result Value Ref Range   WBC 5.8 4.0 - 10.5 K/uL   RBC 5.44 4.22 - 5.81 Mil/uL   Platelets 166.0 150.0 - 400.0 K/uL   Hemoglobin 15.7 13.0 - 17.0 g/dL   HCT 53.0 60.9 - 47.9 %   MCV 86.3 78.0 - 100.0 fl   MCHC 33.4 30.0 - 36.0 g/dL   RDW 85.9 88.4 - 84.4 %  Comprehensive metabolic panel with GFR   Collection Time: 09/25/24  8:47 AM  Result Value Ref Range   Sodium 143 135 - 145 mEq/L   Potassium 3.8 3.5 - 5.1 mEq/L   Chloride 111 96 - 112 mEq/L   CO2 24 19 - 32 mEq/L   Glucose, Bld 89 70 - 99 mg/dL   BUN 12 6 - 23 mg/dL   Creatinine, Ser 8.89 0.40 - 1.50 mg/dL   Total Bilirubin 1.2 0.2 - 1.2 mg/dL   Alkaline Phosphatase 68 39 - 117 U/L   AST 28 0 - 37 U/L   ALT 28 0 - 53 U/L   Total Protein 7.3 6.0 - 8.3 g/dL   Albumin 4.4 3.5 - 5.2 g/dL   GFR 34.84 >39.99 mL/min   Calcium  9.4 8.4 - 10.5 mg/dL  Hemoglobin J8r   Collection Time: 09/25/24  8:47 AM  Result Value Ref Range   Hgb A1c MFr Bld 5.5 4.6 - 6.5 %  Lipid panel   Collection Time: 09/25/24  8:47 AM  Result Value Ref Range   Cholesterol 133 0 - 200 mg/dL   Triglycerides 862.9 0.0 - 149.0 mg/dL   HDL 55.99 >60.99 mg/dL   VLDL 72.5 0.0 - 59.9 mg/dL   LDL Cholesterol 62 0 - 99 mg/dL   Total CHOL/HDL Ratio 3    NonHDL 88.94     "

## 2024-09-22 NOTE — Patient Instructions (Incomplete)
 It was great to see you today, I will be in touch with your labs If not done already recommend 1 dose of RSV vaccine, and a COVID booster this fall Also recommend a tetanus booster at your pharmacy if not done already It all is well please see me in about 6 months

## 2024-09-25 ENCOUNTER — Encounter: Payer: Self-pay | Admitting: Family Medicine

## 2024-09-25 ENCOUNTER — Ambulatory Visit (INDEPENDENT_AMBULATORY_CARE_PROVIDER_SITE_OTHER): Admitting: Family Medicine

## 2024-09-25 VITALS — BP 146/78 | HR 67 | Ht 67.0 in | Wt 163.4 lb

## 2024-09-25 DIAGNOSIS — H409 Unspecified glaucoma: Secondary | ICD-10-CM

## 2024-09-25 DIAGNOSIS — R7303 Prediabetes: Secondary | ICD-10-CM

## 2024-09-25 DIAGNOSIS — Z23 Encounter for immunization: Secondary | ICD-10-CM | POA: Diagnosis not present

## 2024-09-25 DIAGNOSIS — E785 Hyperlipidemia, unspecified: Secondary | ICD-10-CM | POA: Diagnosis not present

## 2024-09-25 DIAGNOSIS — I10 Essential (primary) hypertension: Secondary | ICD-10-CM

## 2024-09-25 LAB — CBC
HCT: 46.9 % (ref 39.0–52.0)
Hemoglobin: 15.7 g/dL (ref 13.0–17.0)
MCHC: 33.4 g/dL (ref 30.0–36.0)
MCV: 86.3 fl (ref 78.0–100.0)
Platelets: 166 K/uL (ref 150.0–400.0)
RBC: 5.44 Mil/uL (ref 4.22–5.81)
RDW: 14 % (ref 11.5–15.5)
WBC: 5.8 K/uL (ref 4.0–10.5)

## 2024-09-25 LAB — HEMOGLOBIN A1C: Hgb A1c MFr Bld: 5.5 % (ref 4.6–6.5)

## 2024-09-25 LAB — COMPREHENSIVE METABOLIC PANEL WITH GFR
ALT: 28 U/L (ref 0–53)
AST: 28 U/L (ref 0–37)
Albumin: 4.4 g/dL (ref 3.5–5.2)
Alkaline Phosphatase: 68 U/L (ref 39–117)
BUN: 12 mg/dL (ref 6–23)
CO2: 24 meq/L (ref 19–32)
Calcium: 9.4 mg/dL (ref 8.4–10.5)
Chloride: 111 meq/L (ref 96–112)
Creatinine, Ser: 1.1 mg/dL (ref 0.40–1.50)
GFR: 65.15 mL/min (ref >60.00)
Glucose, Bld: 89 mg/dL (ref 70–99)
Potassium: 3.8 meq/L (ref 3.5–5.1)
Sodium: 143 meq/L (ref 135–145)
Total Bilirubin: 1.2 mg/dL (ref 0.2–1.2)
Total Protein: 7.3 g/dL (ref 6.0–8.3)

## 2024-09-25 LAB — LIPID PANEL
Cholesterol: 133 mg/dL (ref 0–200)
HDL: 44 mg/dL (ref >39.00)
LDL Cholesterol: 62 mg/dL (ref 0–99)
NonHDL: 88.94
Total CHOL/HDL Ratio: 3
Triglycerides: 137 mg/dL (ref 0.0–149.0)
VLDL: 27.4 mg/dL (ref 0.0–40.0)

## 2024-09-25 MED ORDER — AMLODIPINE BESYLATE 5 MG PO TABS: 5.0000 mg | ORAL_TABLET | Freq: Every day | ORAL | 3 refills | Status: AC

## 2024-09-25 MED ORDER — VALSARTAN 160 MG PO TABS: 160.0000 mg | ORAL_TABLET | Freq: Every day | ORAL | 3 refills | Status: AC

## 2024-09-25 MED ORDER — ROSUVASTATIN CALCIUM 20 MG PO TABS: 20.0000 mg | ORAL_TABLET | Freq: Every day | ORAL | 3 refills | Status: AC

## 2024-09-26 DIAGNOSIS — Z23 Encounter for immunization: Secondary | ICD-10-CM | POA: Diagnosis not present

## 2024-09-30 DIAGNOSIS — R972 Elevated prostate specific antigen [PSA]: Secondary | ICD-10-CM | POA: Diagnosis not present

## 2024-10-07 DIAGNOSIS — L723 Sebaceous cyst: Secondary | ICD-10-CM | POA: Diagnosis not present

## 2024-10-07 DIAGNOSIS — R972 Elevated prostate specific antigen [PSA]: Secondary | ICD-10-CM | POA: Diagnosis not present

## 2024-10-07 DIAGNOSIS — H401123 Primary open-angle glaucoma, left eye, severe stage: Secondary | ICD-10-CM | POA: Diagnosis not present

## 2024-10-07 DIAGNOSIS — R3912 Poor urinary stream: Secondary | ICD-10-CM | POA: Diagnosis not present

## 2024-10-07 DIAGNOSIS — N401 Enlarged prostate with lower urinary tract symptoms: Secondary | ICD-10-CM | POA: Diagnosis not present

## 2025-03-27 ENCOUNTER — Ambulatory Visit: Admitting: Family Medicine
